# Patient Record
Sex: Male | Born: 1965
Health system: Southern US, Community
[De-identification: ages and names within clinical notes are randomized; demographics above are authoritative.]

## PROBLEM LIST (undated history)

## (undated) DIAGNOSIS — I219 Acute myocardial infarction, unspecified: Secondary | ICD-10-CM

## (undated) DIAGNOSIS — N289 Disorder of kidney and ureter, unspecified: Secondary | ICD-10-CM

## (undated) DIAGNOSIS — I10 Essential (primary) hypertension: Secondary | ICD-10-CM

## (undated) DIAGNOSIS — G4730 Sleep apnea, unspecified: Secondary | ICD-10-CM

## (undated) DIAGNOSIS — C801 Malignant (primary) neoplasm, unspecified: Secondary | ICD-10-CM

## (undated) HISTORY — PX: ESOPHAGOGASTRODUODENOSCOPY: SHX1529

## (undated) HISTORY — PX: KIDNEY SURGERY: SHX687

---

## 2006-09-03 ENCOUNTER — Inpatient Hospital Stay (HOSPITAL_COMMUNITY): Admission: EM | Admit: 2006-09-03 | Discharge: 2006-09-06 | Payer: Self-pay | Admitting: Emergency Medicine

## 2006-09-04 ENCOUNTER — Ambulatory Visit: Payer: Self-pay | Admitting: Internal Medicine

## 2007-12-12 ENCOUNTER — Ambulatory Visit (HOSPITAL_COMMUNITY): Admission: RE | Admit: 2007-12-12 | Discharge: 2007-12-12 | Payer: Self-pay | Admitting: Urology

## 2008-01-20 ENCOUNTER — Emergency Department (HOSPITAL_COMMUNITY): Admission: EM | Admit: 2008-01-20 | Discharge: 2008-01-20 | Payer: Self-pay | Admitting: Emergency Medicine

## 2010-10-16 NOTE — Consult Note (Signed)
[name], [name]               ACCOUNT NO.:  [id number]   MEDICAL RECORD NO.:  [id number]          PATIENT TYPE:  INP   LOCATION:  A313                          FACILITY:  [facility]   PHYSICIAN:  Dennie Maizes, M.D.   DATE OF BIRTH:  December 11, 1965   DATE OF CONSULTATION:  DATE OF DISCHARGE:                                 CONSULTATION   REASON FOR CONSULTATION:  Enhancing right renal mass.   CONSULTATION REPORT:  This 45 year old male had a large bowel movement  and stools that were black.  He passed out at home, and he was brought  to the emergency room by ambulance.  He was hypertensive and he was  resuscitated.  He has been evaluated by Dr. Sherene Sires.  EGD has revealed  bleeding duodenal ulcer.  There was a bleeding vessel which was treated  with injection and thermal ceiling.  The patient has done well after  that.   He has undergone evaluation with a CT scan of abdomen and pelvis with  and without contrast.  This revealed a 4 cm sized enhancing mass in the  upper pole of the right kidney.  Part of the mass is exophytic.   The patient denied having any voiding difficulty, hematuria or flank  pain in the past.  Has undergone evaluation at [facility] in [place] in  2005.  He had duodenitis and gastritis at that time.  He had also been  informed about the right renal mass at that time.  It was thought to be  a cyst and the patient was advised to have regular follow-up.  Has not  seen his urologist for several years.   PAST MEDICAL HISTORY:  1. History of duodenitis and gastritis in 2005.  2. History of renal mass noted at that time.  3. Hyperthyroidism.   MEDICATIONS:  None   ALLERGIES:  None.   PHYSICAL EXAMINATION:  ABDOMEN:  Soft.  No palpable flank mass.  No  costovertebral angle tenderness.  Bladder not palpable.  Penis and  testes are normal.   ADMISSION LABORATORIES:  BUN 13, creatinine 0.92, electrolytes within  normal range.  CBC:  WBC 7.0, hemoglobin 10.1, hematocrit  28.4.  Urinalysis was completely negative.  PT 13.9, PTT 23, INR 1.1.  CT scan  of abdomen and pelvis with and without contrast revealed a 4 cm sized  enhancing mass within the lateral aspect of upper pole of the right  kidney.  Renal veins were patent.  There was no evidence of intra-  abdominal metastatic lesions.   IMPRESSION:  A 4 cm size enhancing right renal mass, possible renal cell  carcinoma.   PLAN:  I informed the patient regarding the possible diagnosis and  management options.  He is interested in laparoscopic/robotic surgery.  Will refer him to [facility] for possible  laparoscopic/robotic/ partial or radical nephrectomy.  The patient will  return to the office on 09/09/2006.  I plan to make arrangements for the  patient's referral to [facility] at that time.      Dennie Maizes, M.D.  Electronically Signed  SK/MEDQ  D:  09/05/2006  T:  09/06/2006  Job:  [id number]

## 2010-10-16 NOTE — H&P (Signed)
[name], [name]               ACCOUNT NO.:  [id number]   MEDICAL RECORD NO.:  [id number]          PATIENT TYPE:  INP   LOCATION:  IC03                          FACILITY:  [facility]   PHYSICIAN:  Margaretmary Dys, M.D.DATE OF BIRTH:  1966-05-06   DATE OF ADMISSION:  09/03/2006  DATE OF DISCHARGE:  LH                              HISTORY & PHYSICAL   ADMISSION DIAGNOSES:  1. Gastrointestinal bleed.  2. Syncope.  3. Obesity.   HISTORY OF PRESENT ILLNESS:  [name] is a 45 year old African-  American male who was brought into the emergency room by 911.  The  patient reported that he was doing fairly well until he went into the  bathroom and had a large bowel movement consisting of mostly black-  appearing stools.  He also had some maroon-colored stools.  He lives  pretty close to the hospital and was going to walk to the emergency  room, but when he was getting ready to leave the house, he passed out.  His daughter who is 4 years old subsequently called 911 and EMS came to  his house.  When he arrived, he was hypotensive with systolic blood  pressure in the 90 range.  In the emergency room, he was found to be  hemodynamically stable.  He received some IV fluids.  He was seen by  Alvin Critchley, M.D. who obtained his vital signs.  There was no other  evidence of active bright red bleeding at the time.  Orthostatic vitals  were not checked.  The patient was subsequently transferred to the  intensive care unit for further evaluation and management.   REVIEW OF SYSTEMS:  A 10-point review of systems was otherwise negative  except as mentioned in history of present illness.   PAST MEDICAL HISTORY:  1. History of duodenitis at [facility] in [place] back in 2005.  The      patient said he had an endoscopy which confirmed some duodenitis      and he was supposed to be on Nexium, but has not been taking it.  2. History of right kidney mass.  The exact etiology remains unclear.  3.  Hyperthyroid.   MEDICATIONS:  None.   ALLERGIES:  No known drug allergies.   SOCIAL HISTORY:  The patient is divorced and lives with his 13 year old  daughter.  He just recently got laid off from work.   He denies any alcohol or illicit drugs.  He does not smoke.   FAMILY HISTORY:  No family history of ulcers, hypertension, or diabetes.   PHYSICAL EXAMINATION:  GENERAL:  Conscious, alert, comfortable, not in  acute distress.  VITAL SIGNS:  Remained stable with blood pressure 127/68, pulse ranged  between 108 to 113, respiratory rate 16.  TMAX was 98.5.  Oxygen  saturation was 98% on room air.  HEENT:  Normocephalic and atraumatic.  Oral mucosa was moist with no  exudates.  NECK:  Supple, no JVD.  No lymphadenopathy.  LUNGS:  Clear clinically with good air entry bilaterally.  HEART:  S1 and S2 regular, no S3, S4, gallops, or  rubs.  ABDOMEN:  Soft and nontender.  Bowel sounds positive.  No masses  palpable.  EXTREMITIES:  No edema.  NEUROLOGY:  Grossly intact with no focal neurological deficits.   LABORATORY DATA:  His white blood cell count 15.7, hemoglobin 11.6,  hematocrit 32.7, platelet count 202, neutrophils 85%.  PT was 13.9, INR  1.1, PTT of 23.  Sodium 136, potassium 4.3, chloride 110, CO2 22,  glucose 99, BUN 38, creatinine 0.97, calcium 8.2.  Urinalysis was  negative.   ASSESSMENT:  [name] is a 45 year old African-American male who  presented to the emergency room with syncope and symptoms consistent  with GI bleed.  Based on the appearance of his bowel movement and his  elevated BUN it is likely that this is an upper GI bleed.  Also the  patient was told in the past that he has duodenitis and duodenal ulcer,  but did not take any therapy.  We are awaiting other records from [facility] to further confirm this.   The patient was seen by R. Roetta Sessions, M.D. and an EGD is planned for  this morning.  He remains stable, has not required any blood   transfusions.  We will continue on current fluid therapy and PPI b.i.d.  I have explained the above plan to the patient and he verbalized full  understanding.  We will obtain further records from [facility]  regarding the mass in his right kidney and we will proceed with a CT  scan of the abdomen and pelvis in the morning to further evaluate it.      Margaretmary Dys, M.D.  Electronically Signed     AM/MEDQ  D:  09/04/2006  T:  09/04/2006  Job:  [id number]

## 2010-10-16 NOTE — Consult Note (Signed)
NAMEESPEN, BETHEL               ACCOUNT NO.:  [id number]   MEDICAL RECORD NO.:  [id number]          PATIENT TYPE:  INP   LOCATION:  IC03                          FAC[facility]:  [facility]   PHYSICIAN:  R. Roetta Sessions, M.D. DATE OF BIRTH:  [birth year]   DATE OF CONSULTATION:  09/03/2006  DATE OF DISCHARGE:                                 CONSULTATION   REASON FOR CONSULTATION:  GI bleed.   HISTORY OF PRESENT ILLNESS:  Mr. [name] is a pleasant 45 year old  African American male who was in his usual state of apparent good health  until this evening after his daughter made some hamburgers.  He had the  urge to have a bowel movement, went to the bathroom, and had a large  voluminous black-appearing bowel movement with some maroon-colored  stool.  He became concerned about this observation and was making plans  to walk to the emergency department (he lives about a half block from  the hospital).  When he was getting ready to go, he passed out at home.  His daughter activated EMS, called 911.  He was found to be hypotensive,  when EMS arrived with a pressure in the 90 range.  He was brought to  [facility], where he was seen and resuscitated with fluids by  Dr. Neale Burly.  He rapidly stabilized.   INITIAL LABORATORY STUDIES:  Revealed a white count of 15,700, H&H 11.6  and 32.7, MCV 81.9, platelet count 202,000.  ProTime INR 1.1. BUN 38,  creatinine 0.97.   Mr. [name] is now seen in the ICU where he is alert, and appears  comfortable.  He has a blood pressure of 132/89, a pulse of 100.  Mr.  [name] tells me that he has not had any recent associated abdominal  pain.  His bowel movements have been normal up until this evening.  He  has not lost any weight.  No upper GI tract symptoms such as  odynophagia, dysphagia, no sign of reflux symptoms, nausea, or vomiting.  He gives a vague history of having a upper and lower endoscopy for some  stomach problems at [facility] in [place]  back in 2005.  He was  prescribed Nexium.  He failed to follow up with any other doctors as  instructed and subsequently moved to [place].  Apparently, he  describes as being hemoccult positive at that time but he did not have  any significant findings and the records are not available at this time  for review.   Mr. [name] is not taking any medications.  He denies any form of  nonsteroidals, ASA, OTC on review.  He is not allergic to any  medications.  He does not use alcohol or illicit drugs.  There is no  family history of GI problems including GI neoplasia.   PAST MEDICAL HISTORY:  1. Significant for a workup for some GI problems at [facility] in      [place] in 2005, the details are sketchy as described above.  2. History of inguinal hernia repair as a child.   CURRENT MEDICATIONS:  None.  ALLERGIES:  NO KNOWN DRUG ALLERGIES.   FAMILY HISTORY:  No chronic GI or liver illness.   SOCIAL HISTORY:  The patient is single.  He is originally from  [place].  He has two grown daughters in [place].  He has one 10-year-  old daughter who lives with him here in [place].  He does not smoke,  does not use alcohol or illicit drugs.   REVIEW OF SYSTEMS:  There has been no weight loss recently, no fever,  chills, no chest pain, no dyspnea.   PHYSICAL EXAMINATION:  GENERAL:  Reveals a very pleasant 45 year old  gentleman, resting comfortably in ICU bed 3.  VITAL SIGNS:  Blood pressure 132/89, pulse 100, respiratory rate 18.  SKIN:  Warm and dry.  HEENT:  No scleral icterus.  Conjunctivae are pink.  Oral cavity:  No  lesions.  CHEST:  Lungs are clear to auscultation.  CARDIAC:  Regular rate and rhythm without murmur, gallop or rub.  ABDOMEN:  Somewhat obese, positive bowel sounds, entirely soft and  nontender without appreciable or organomegaly.  EXTREMITIES:  No edema.   LABORATORY DATA:  White count 15.7, H&H 11.6 and 32.7, MCV 81.9,  platelet count 202,000.  ProTime  13.9, INR 1.1.  Sodium 136, potassium  4.3, and chloride 110, CO2 22, glucose 99, BUN 38, creatinine 0.97.   IMPRESSION:  Mr. [name] is a very pleasant 45 year old African  American male admitted to the hospital this evening with a  hemodynamically significant gastrointestinal bleed.  He describes some  melena and given his elevated BUN, I suspect the origin of bleeding is  more upper gastrointestinal tract than the distal small bowel or colon.  At this point in time, he appears hemodynamically stable.  As he  equilibrates, his hemoglobin may likely fall significantly from the  evening's baseline.  As far as a discrete etiology is concerned, he will  need further evaluation.  He really does not have any apparent risk  factors for peptic ulcer disease.  He give a sketchy history of  endoscopic evaluation by gastroenterologist in [place] back in 2005,  would certainly like to review those records and will attempt to get  them.   RECOMMENDATIONS:  1. Lets hold off on NG tube for the time being.  2. Continue close observation in the ICU.  3. I fully agree with the b.i.d. PPI therapy.  4. I agree with following H&H, would consider transfusion if his H&H      plummets to keep his hemoglobin and hematocrit in the 10 and 30      range.  5. I have offered Mr. [name] an EGD tomorrow morning.  It would be a      bedside procedure in the ICU.  I have discussed the risks,      benefits, and alternatives with Mr. [name].  His questions were      answered.  He is agreeable.  I will try and get those records faxed      over from [facility] in the next 24 hours.   I would like to thank Dr. Alvin Critchley and Dr. Dorris Singh for  allowing me to see this very nice gentleman this evening.  I will be  following Mr. [name] along with Dr. Elige Radon while he is here.      Jonathon Bellows, M.D.  Electronically Signed    RMR/MEDQ  D:  09/03/2006  T:  09/03/2006  Job:  [id number]   cc:  Alvin Critchley, M.D.  [address]  [place], [place] [address]   Dorris Singh  Fax: [phone]

## 2010-10-16 NOTE — Op Note (Signed)
NAMENELLIE, CHEVALIER               ACCOUNT NO.:  [id number]   MEDICAL RECORD NO.:  [id number]          PATIENT TYPE:  INP   LOCATION:  IC03                          FACILITY:  [facility]   PHYSICIAN:  R. Roetta Sessions, M.D. DATE OF BIRTH:  1965-09-18   DATE OF PROCEDURE:  09/04/2006  DATE OF DISCHARGE:                                PROCEDURE NOTE   PROCEDURE:  EGD at the bedside in the ICU with therapeutic intervention.   INDICATIONS FOR PROCEDURE:  The patient is a 45 year old gentleman who  presented to the hospital last night after having an episode of  melena/maroon stools with syncope.  He was fully resuscitated in the  emergency department.  He has been stable over night.  His H&H's remain  stable at 11.5 and 32.5 this morning.   His old records from [facility] in [place] have made it to our  chart.   Review revealed that he had an EGD in 2005.  He was found to have  gastritis and duodenitis.  HP serologies were positive.  He has not been  treated.  He has also had a suspicious right renal lesion for which  followup was recommended.  This was not done either.  EGD is now being  done at the bedside in the ICU.  The potential risks, benefits, and  alternatives have been reviewed with Mr. Nordahl last night and this  morning.  He is agreeable.  His questions were answered.   PROCEDURE:  O2 saturation, blood pressure, pulse, and respirations were  monitored throughout the entire procedure.  Conscious sedation:  Versed  3 mg IV and Demerol 100 mg IV in divided doses.  Cetacaine spray for  topical oropharyngeal anesthesia.  Instrument:  Pentax video chip  system.   FINDINGS:  Examination of the tubular esophagus revealed a non-critical  Schatzki's ring; otherwise, the mucosa appeared normal.  The EG junction  was easily traversed.   Stomach:  The gastric cavity was empty and insufflated well with air.  Thorough examination of the gastric mucosa including retroflexed view  of  the proximal stomach and esophagogastric junction demonstrated a small  hiatal hernia and some small antral prepyloric erosions.  There was no  blood in the stomach.  The pylorus was patent and easily traversed.   Examination of the bulb and second portion revealed a 1-cm ulcer in the  bulb with a central visible vessel.  There were surrounding erosions of  the bulb; otherwise, D1 and D2 appeared normal.   THERAPEUTIC/DIAGNOSTIC MANEUVERS:  4 mL of 1:10,000 epinephrine was  injected on either side of the ulcer crater.  The visible vessel was  sealed with four applications of the Gold probe at 25 joules each.  This  was done without difficulty and minimal bleeding.   The patient tolerated the procedure well and was reactive in the ICU.   IMPRESSION:  1. Non-critical Schatzki's ring; otherwise, normal esophagus.  2. Small hiatal hernia.  3. Antral and bulbar erosions.  4. A 1-CM DUODENAL BULBAR ULCER WITH VISIBLE VESSEL TREATED WITH      INJECTION  AND THERMAL SEALING, AS DESCRIBED ABOVE.  Otherwise, the      first and second portions of the duodenum appeared normal.   RECOMMENDATIONS:  1. Begin Prevpac p.o. x14 days.  2. Clear liquid diet today.  3. Will defer to Dr. Elige Radon and associates as to workup of the right      renal lesion seen on prior CT.  4. Will repeat his CBC tomorrow morning.      Jonathon Bellows, M.D.  Electronically Signed     RMR/MEDQ  D:  09/04/2006  T:  09/04/2006  Job:  [id number]   cc:   Ethelene Browns., M.D.  Fax: [phone]   Alvin Critchley, M.D.  [address]  [place], [place] [address]

## 2010-10-16 NOTE — Discharge Summary (Signed)
[name], [name]               ACCOUNT NO.:  [id number]   MEDICAL RECORD NO.:  [id number]          PATIENT TYPE:  INP   LOCATION:  A313                          FACILITY:  [facility]   PHYSICIAN:  Osvaldo Shipper, MD     DATE OF BIRTH:  1966-03-05   DATE OF ADMISSION:  09/03/2006  DATE OF DISCHARGE:  04/08/2008LH                               DISCHARGE SUMMARY   Patient does not have a primary medical doctor.   DISCHARGE DIAGNOSES:  1. Acute blood loss anemia secondary to gastrointestinal bleed.  2. Duodenal ulcer is the likely source of bleeding.  3. Obesity.  4. Right renal mass likely renal cell carcinoma, requiring followup.   Please review H&P dictated by Dr. Sherle Poe.   BRIEF HOSPITAL COURSE:  Briefly, this is a 45 year old African-American  male who presented to the emergency department complaining of black  stools, lightheadedness, dizziness.  He was found to be initially  hypotensive with systolic blood pressures in the 90's.  He received IV  fluids.  His hemoglobin was found to be 11.6; hence, he was not  transfused.  He was monitored in the ICU and he underwent endoscopy  which was not a complete study but it revealed a duodenal ulcer with a  visible vessel but no active bleeding was noted.  The vessel was sealed  with the help of a Gold Probe.  Patient was monitored.  He continued to  have some black stools but his stools were more formed.  His hemoglobin  did go down to 9.1 today; however, patient is asymptomatic.  I think he  is still passing some of the old blood.  I discussed this issue with Dr.  Karilyn Cota and he felt the patient could go home and then followup with Dr.  Jena Gauss in about 8 weeks' time for H. Pylori breath study and maybe a  followup endoscopy.  He was started on Prevpac which will be continued  for 14 days and after that he needs to continue with omeprazole for at  least 4-6 weeks more.  He has been asked to avoid alcoholic drinks and  products, has  been asked to avoid smoking.  He has been asked to be  compliant with his medications and follow up with all of his  appointments.   He also gave a history on admission of a possible renal mass that was  detected about 3 years ago at [facility].  This prompted a CAT scan of his  abdomen, pelvis which indeed revealed a 4 cm right renal mass consistent  with renal cell carcinoma, no metastatic process was noted.  No other  abnormalities were noted on the CT of the abdomen or pelvis.  He was  seen by Dr. Dennie Maizes, urologist, who recommended following up with  him in a few days' time to consider various options for this patient.   Patient's obesity, he was counseled on diet and weight reduction.  He  was also told to be very compliant with his medications as discussed  above.   DISCHARGE MEDICATIONS:  1. Prevpac one dose b.i.d. for 14  days.  2. Once the above is completed he should start omeprazole 20 mg once a      day until he is seen by Dr. Jena Gauss.   He has been told that if he feels lightheaded, dizzy, if he noticed  blood in his stool he needs to seek attention immediately.   FOLLOWUP:  1. With Dr. Jena Gauss in 8 weeks.  2. With Dr. Dennie Maizes on September 09, 2006.   Patient does not have a PMD.  He is having some troubles with his  insurance and his job, so he wants to defer that issue for now.  I think  as long as patient is followed by the GI folks and the urologist he  should be okay.  Once his insurance is all set up he can proceed with  PMD at that point.   Once again, consultations obtained from GI and Dr. Dennie Maizes.   PROCEDURES UNDERGONE:  An EGD as discussed above.   IMAGING STUDIES:  1. A CT of the pelvis as discussed above.  2. He also underwent a foot x-ray which was of the right foot and was      unremarkable.   TOTAL TIME AT DISCHARGE:  Forty minutes.      Osvaldo Shipper, MD  Electronically Signed     GK/MEDQ  D:  09/06/2006  T:  09/06/2006   Job:  [id number]   cc:   R. Roetta Sessions, M.D.  [address]  [place]  [place] [address]   Dennie Maizes, M.D.  Fax: [phone]

## 2010-10-16 NOTE — Group Therapy Note (Signed)
NAMECLIFTON, SAFLEY               ACCOUNT NO.:  [id number]   MEDICAL RECORD NO.:  [id number]          PATIENT TYPE:  INP   LOCATION:  IC03                          FACILITY:  [facility]   PHYSICIAN:  Margaretmary Dys, M.D.DATE OF BIRTH:  December 05, 1965   DATE OF PROCEDURE:  09/04/2006  DATE OF DISCHARGE:                                 PROGRESS NOTE   SUBJECTIVE:  Patient is comfortable, says he is hungry.  He is currently  awaiting an EGD.  He has had no evidence of active bleeding since being  in the ICU.  There is a suspicion of an upper GI bleed.  He denies any  abdominal pain.   He has been hemodynamically stable all night.   OBJECTIVE:  Conscious, alert, comfortable, not in acute distress.  VITAL SIGNS:  Blood pressure is 129/78, pulse of 91, respiration was 16,  T-max 98.5 degrees Fahrenheit, oxygen saturation was 95% on room air.  HEENT:  Normocephalic, atraumatic.  Oral mucosa was moist with no  exudates.  NECK:  Supple, no JVD, no lymphadenopathy.  LUNGS:  Clear clinically with good air entry bilaterally.  HEART:  S1, S2 regular.  No S3, S4, gallops or rubs.  ABDOMEN:  Soft, obese, bowel sounds were positive.  No epigastric  tenderness.  No guarding, no rigidity was noted.  EXTREMITIES:  No pitting, pedal edema.   LABORATORY DIAGNOSTIC DATA:  White blood cell count 12.4, hemoglobin is  stable at 11.8, hematocrit 34.6, platelet count was 234,000.  Sodium  136, potassium 4.1, chloride of 108, CO2 22, glucose is 102, BUN of 31,  creatinine was 0.88.   ASSESSMENT AND PLAN:  Mr. [name] is a 45 year old African-American male  with no significant past medical history who presents to the Emergency  Room with syncope.  He had a history of melena stools prior to the  syncopal episode.  There is a suspicion of upper GI bleed.  He has  remained hemodynamically stable throughout his hospitalization although  orthostatics were not checked.   Patient currently awaiting an EGD this  morning by GI.  I will continue  him on proton pump inhibitor twice daily.  Subsequent recommendations  depending on the findings from GI.  Will repeat a CBC at 6 p.m. tonight.   DISPOSITION:  He remains stable in the Intensive Care Unit.      Margaretmary Dys, M.D.  Electronically Signed     AM/MEDQ  D:  09/04/2006  T:  09/04/2006  Job:  [id number]

## 2011-10-07 ENCOUNTER — Other Ambulatory Visit: Payer: Self-pay

## 2011-10-14 ENCOUNTER — Ambulatory Visit: Payer: BC Managed Care – PPO | Attending: Family Medicine | Admitting: Sleep Medicine

## 2011-10-14 DIAGNOSIS — Z6841 Body Mass Index (BMI) 40.0 and over, adult: Secondary | ICD-10-CM | POA: Insufficient documentation

## 2011-10-14 DIAGNOSIS — G4700 Insomnia, unspecified: Secondary | ICD-10-CM

## 2011-10-14 DIAGNOSIS — G4733 Obstructive sleep apnea (adult) (pediatric): Secondary | ICD-10-CM | POA: Insufficient documentation

## 2011-10-16 NOTE — Procedures (Signed)
[name], WISEHART               ACCOUNT NO.:  [id number]  MEDICAL RECORD NO.:  [id number]          PATIENT TYPE:  OUT  LOCATION:  SLEEP LAB                     FACILITY:  [facility]  PHYSICIAN:  Yaretzi Ernandez A. Gerilyn Pilgrim, M.D. DATE OF BIRTH:  1965/06/13  DATE OF STUDY:  10/14/2011                           NOCTURNAL POLYSOMNOGRAM  REFERRING PHYSICIAN:  Scott A. Luking, MD  INDICATION:  A 46 year old man who had a home sleep study showing obstructive sleep apnea syndrome.  This is a titration recording.  EPWORTH SLEEPINESS SCALE: 1. BMI 45.  MEDICATIONS:  None.  ARCHITECTURAL SUMMARY:  This is a titration study.  The total recording time is 441 minutes.  Sleep efficiency 88%.  Sleep latency is 0.5 minutes.  REM latency 60 minutes.  Stage N1 4%, N2 55%, N3 12%, and REM sleep 29%.  RESPIRATORY SUMMARY:  Baseline saturation is 95, lowest saturation 86 during non-REM sleep.  The patient was placed on positive pressure and titrated between the pressure of 6 and 14, optimal pressure is 14 with resolution of events and good tolerance.  LIMB MOVEMENT SUMMARY:  PLM index 0.  ELECTROCARDIOGRAM SUMMARY:  Average heart rate is 67 with no significant dysrhythmias observed.  IMPRESSION:  Obstructive sleep apnea syndrome, which responds well to a continuous positive airway pressure of 14.   [name] A. Gerilyn Pilgrim, M.D.    KAD/MEDQ  D:  10/16/2011 17:26:33  T:  10/16/2011 23:48:01  Job:  [id number]

## 2011-12-18 ENCOUNTER — Emergency Department (HOSPITAL_COMMUNITY)
Admission: EM | Admit: 2011-12-18 | Discharge: 2011-12-18 | Disposition: A | Discharge status: Home / Self Care | Payer: BC Managed Care – PPO | Attending: Emergency Medicine | Admitting: Emergency Medicine

## 2011-12-18 ENCOUNTER — Encounter (HOSPITAL_COMMUNITY): Payer: Self-pay | Admitting: *Deleted

## 2011-12-18 DIAGNOSIS — I10 Essential (primary) hypertension: Secondary | ICD-10-CM | POA: Insufficient documentation

## 2011-12-18 DIAGNOSIS — M545 Low back pain, unspecified: Secondary | ICD-10-CM | POA: Insufficient documentation

## 2011-12-18 DIAGNOSIS — M549 Dorsalgia, unspecified: Secondary | ICD-10-CM

## 2011-12-18 DIAGNOSIS — N289 Disorder of kidney and ureter, unspecified: Secondary | ICD-10-CM | POA: Insufficient documentation

## 2011-12-18 HISTORY — DX: Disorder of kidney and ureter, unspecified: N28.9

## 2011-12-18 HISTORY — DX: Essential (primary) hypertension: I10

## 2011-12-18 HISTORY — DX: Malignant (primary) neoplasm, unspecified: C80.1

## 2011-12-18 MED ORDER — DIAZEPAM 10 MG PO TABS: 10.0000 mg | ORAL_TABLET | Freq: Three times a day (TID) | ORAL | Status: AC | PRN

## 2011-12-18 MED ORDER — OXYCODONE-ACETAMINOPHEN 5-325 MG PO TABS: 1.0000 | ORAL_TABLET | ORAL | Status: AC | PRN

## 2011-12-18 MED ORDER — OXYCODONE-ACETAMINOPHEN 5-325 MG PO TABS
1.0000 | ORAL_TABLET | Freq: Once | ORAL | Status: AC
  Administered 2011-12-18: 1 via ORAL
  Filled 2011-12-18: qty 1

## 2011-12-18 MED ORDER — DIAZEPAM 5 MG PO TABS
10.0000 mg | ORAL_TABLET | Freq: Once | ORAL | Status: AC
Start: 2011-12-18 — End: 2011-12-18
  Administered 2011-12-18: 10 mg via ORAL
  Filled 2011-12-18: qty 2

## 2011-12-18 NOTE — ED Provider Notes (Signed)
History  This chart was scribed for Flint Melter, MD by Erskine Emery. This patient was seen in room APA01/APA01 and the patient's care was started at 20:55.   CSN: [id number]  Arrival date & time 12/18/11  2024   First MD Initiated Contact with Patient 12/18/11 2055      No chief complaint on file.   (Consider location/radiation/quality/duration/timing/severity/associated sxs/prior treatment) HPI  [name] is a 46 y.o. male who presents to the Emergency Department complaining of constant moderate left lower back pain since 2 am this morning. Pt denies any h/o back pain but reports some stiffness. Pt denies taking any medication for his pain. Pt reports a straining bowel movement last night. 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   Dr. Gerda Diss is pt's PCP.   Past Medical History  Diagnosis Date  . Hypertension   . Renal disorder   . Cancer     History reviewed. No pertinent past surgical history.  History reviewed. No pertinent family history.  History  Substance Use Topics  . Smoking status: Never Smoker   . Smokeless tobacco: Not on file  . Alcohol Use: No      Review of Systems  Constitutional: Negative for fever and chills.  Respiratory: Negative for cough and shortness of breath.   Gastrointestinal: Negative for nausea and vomiting.  Musculoskeletal: Positive for back pain.  Neurological: Negative for dizziness and weakness.  All other systems reviewed and are negative.    Allergies  Review of patient's allergies indicates no known allergies.  Home Medications   Current Outpatient Rx  Name Route Sig Dispense Refill  . DIAZEPAM 10 MG PO TABS Oral Take 1 tablet (10 mg total) by mouth every 8 (eight) hours as needed (muscle spasm). 20 tablet 0  . OXYCODONE-ACETAMINOPHEN 5-325 MG PO TABS Oral Take 1 tablet by mouth every 4 (four) hours as needed for pain. 20 tablet 0    Triage Vitals: BP 148/94  Pulse 88  Temp 100.3 F  (37.9 C) (Oral)  Resp 20  Ht 5\' 7"  (1.702 m)  Wt 290 lb (131.543 kg)  BMI 45.42 kg/m2  SpO2 99%  Physical Exam  Nursing note and vitals reviewed. Constitutional: He is oriented to person, place, and time. He appears well-developed and well-nourished.  HENT:  Head: Normocephalic and atraumatic.  Right Ear: External ear normal.  Left Ear: External ear normal.  Eyes: Conjunctivae and EOM are normal. Pupils are equal, round, and reactive to light.  Neck: Normal range of motion and phonation normal. Neck supple.  Cardiovascular: Normal rate, regular rhythm, normal heart sounds and intact distal pulses.   Pulmonary/Chest: Effort normal and breath sounds normal. He exhibits no bony tenderness.  Abdominal: Soft. Normal appearance. There is no tenderness.  Musculoskeletal: He exhibits tenderness.       Left lower back pain, positive SLR at 20 degrees on the left. No spine tenderness.   Neurological: He is alert and oriented to person, place, and time. He has normal strength. No cranial nerve deficit or sensory deficit. He exhibits normal muscle tone. Coordination normal.  Skin: Skin is warm, dry and intact.  Psychiatric: He has a normal mood and affect. His behavior is normal. Judgment and thought content normal.    ED Course  Procedures (including critical care time)  DIAGNOSTIC STUDIES: Oxygen Saturation is 99% on room air, normal by my interpretation.    COORDINATION OF CARE: 21:05--I evaluated the patient and we discussed  a treatment plan including percocet and valium to which the pt agreed. I informed the pt that this will probably get better on its own and could possibly be arthritis related.   21:15--Medication orders: Oxycodone-acetaminophen (Percocet/Roxicet) 5-325 mg per tablet, 1 tablet--once   Diazepam (Valium) tablet 10 mg--once   Labs Reviewed - No data to display No results found.   1. Back pain       MDM  Nonspecific low back pain, related to unusual work.  This most likely represents musculoskeletal pain. Doubt spinal stenosis, or myelopathy. Possible radiculopathy.      Plan: Home Medications- percocet and valium; Home Treatments- rest, general ambulation, heat and occasional ibuprofen; Recommended follow up- Dr. Gerda Diss if necessary.  I personally performed the services described in this documentation, which was scribed in my presence. The recorded information has been reviewed and considered.           Flint Melter, MD 12/19/11 [phone]

## 2011-12-18 NOTE — ED Notes (Signed)
Pt reporting pain in left hip. Reports pain started about 2 am.  Denies taking any Tylenol or Ibuprofen. Denies any injury or additional activity other than normal work.

## 2013-01-16 ENCOUNTER — Other Ambulatory Visit: Payer: Self-pay | Admitting: Family Medicine

## 2013-11-09 ENCOUNTER — Ambulatory Visit (INDEPENDENT_AMBULATORY_CARE_PROVIDER_SITE_OTHER): Payer: BC Managed Care – PPO | Admitting: Family Medicine

## 2013-11-09 ENCOUNTER — Encounter: Payer: Self-pay | Admitting: Family Medicine

## 2013-11-09 VITALS — BP 190/100 | Ht 67.0 in | Wt 298.0 lb

## 2013-11-09 DIAGNOSIS — Z0189 Encounter for other specified special examinations: Secondary | ICD-10-CM

## 2013-11-09 DIAGNOSIS — I10 Essential (primary) hypertension: Secondary | ICD-10-CM

## 2013-11-09 MED ORDER — TRIAMTERENE-HCTZ 75-50 MG PO TABS: 1.0000 | ORAL_TABLET | Freq: Every day | ORAL | Status: DC

## 2013-11-09 NOTE — Progress Notes (Signed)
   Subjective:    Patient ID: SCHON ZEIDERS, male    DOB: [birth year], 48 y.o.   MRN: [id number]  Hypertension This is a chronic problem. The current episode started more than 1 year ago. The problem is uncontrolled. There are no associated agents to hypertension. There are no known risk factors for coronary artery disease. Treatments tried: triamterene-hctz. The current treatment provides significant improvement. There are no compliance problems.    Patient states he wants to discuss weight loss options.  He relates he does not get a lot of physical activity because of his work He relates he tries the healthy but sometimes he finds himself eating fast food.  Review of Systems Currently he denies chest tightness pressure pain shortness of breath nausea vomiting diarrhea    Objective:   Physical Exam  Vitals reviewed. Constitutional: He appears well-nourished. No distress.  Cardiovascular: Normal rate, regular rhythm and normal heart sounds.   No murmur heard. Pulmonary/Chest: Effort normal and breath sounds normal. No respiratory distress.  Musculoskeletal: He exhibits no edema.  Lymphadenopathy:    He has no cervical adenopathy.  Neurological: He is alert.  Psychiatric: His behavior is normal.          Assessment & Plan:  #1 HTN-low salt diet physical activity lose weight in addition to this continue blood pressure medicine followup in 3 months for recheck he ran out of his medicine I told him to try not to do that again Morbid obesity exercise diet try to lose weight would be helpful he may need to have surgery.

## 2013-11-09 NOTE — Patient Instructions (Signed)
DASH Diet  The DASH diet stands for "Dietary Approaches to Stop Hypertension." It is a healthy eating plan that has been shown to reduce high blood pressure (hypertension) in as little as 14 days, while also possibly providing other significant health benefits. These other health benefits include reducing the risk of breast cancer after menopause and reducing the risk of type 2 diabetes, heart disease, colon cancer, and stroke. Health benefits also include weight loss and slowing kidney failure in patients with chronic kidney disease.   DIET GUIDELINES  · Limit salt (sodium). Your diet should contain less than 1500 mg of sodium daily.  · Limit refined or processed carbohydrates. Your diet should include mostly whole grains. Desserts and added sugars should be used sparingly.  · Include small amounts of heart-healthy fats. These types of fats include nuts, oils, and tub margarine. Limit saturated and trans fats. These fats have been shown to be harmful in the body.  CHOOSING FOODS   The following food groups are based on a 2000 calorie diet. See your Registered Dietitian for individual calorie needs.  Grains and Grain Products (6 to 8 servings daily)  · Eat More Often: Whole-wheat bread, brown rice, whole-grain or wheat pasta, quinoa, popcorn without added fat or salt (air popped).  · Eat Less Often: White bread, white pasta, white rice, cornbread.  Vegetables (4 to 5 servings daily)  · Eat More Often: Fresh, frozen, and canned vegetables. Vegetables may be raw, steamed, roasted, or grilled with a minimal amount of fat.  · Eat Less Often/Avoid: Creamed or fried vegetables. Vegetables in a cheese sauce.  Fruit (4 to 5 servings daily)  · Eat More Often: All fresh, canned (in natural juice), or frozen fruits. Dried fruits without added sugar. One hundred percent fruit juice (½ cup [237 mL] daily).  · Eat Less Often: Dried fruits with added sugar. Canned fruit in light or heavy syrup.  Lean Meats, Fish, and Poultry (2  servings or less daily. One serving is 3 to 4 oz [85-114 g]).  · Eat More Often: Ninety percent or leaner ground beef, tenderloin, sirloin. Round cuts of beef, chicken breast, turkey breast. All fish. Grill, bake, or broil your meat. Nothing should be fried.  · Eat Less Often/Avoid: Fatty cuts of meat, turkey, or chicken leg, thigh, or wing. Fried cuts of meat or fish.  Dairy (2 to 3 servings)  · Eat More Often: Low-fat or fat-free milk, low-fat plain or light yogurt, reduced-fat or part-skim cheese.  · Eat Less Often/Avoid: Milk (whole, 2%). Whole milk yogurt. Full-fat cheeses.  Nuts, Seeds, and Legumes (4 to 5 servings per week)  · Eat More Often: All without added salt.  · Eat Less Often/Avoid: Salted nuts and seeds, canned beans with added salt.  Fats and Sweets (limited)  · Eat More Often: Vegetable oils, tub margarines without trans fats, sugar-free gelatin. Mayonnaise and salad dressings.  · Eat Less Often/Avoid: Coconut oils, palm oils, butter, stick margarine, cream, half and half, cookies, candy, pie.  FOR MORE INFORMATION  The Dash Diet Eating Plan: www.dashdiet.org  Document Released: 05/06/2011 Document Revised: 08/09/2011 Document Reviewed: 05/06/2011  ExitCare® Patient Information ©2014 [employer].

## 2014-01-29 ENCOUNTER — Encounter: Payer: Self-pay | Admitting: Family Medicine

## 2014-01-29 LAB — HEPATIC FUNCTION PANEL
ALT: 18 U/L (ref 0–53)
AST: 22 U/L (ref 0–37)
Albumin: 4.3 g/dL (ref 3.5–5.2)
Alkaline Phosphatase: 56 U/L (ref 39–117)
Bilirubin, Direct: 0.2 mg/dL (ref 0.0–0.3)
Indirect Bilirubin: 0.5 mg/dL (ref 0.2–1.2)
Total Bilirubin: 0.7 mg/dL (ref 0.2–1.2)
Total Protein: 7.5 g/dL (ref 6.0–8.3)

## 2014-01-29 LAB — BASIC METABOLIC PANEL
BUN: 17 mg/dL (ref 6–23)
CO2: 30 mEq/L (ref 19–32)
Calcium: 9.7 mg/dL (ref 8.4–10.5)
Chloride: 101 mEq/L (ref 96–112)
Creat: 1.04 mg/dL (ref 0.50–1.35)
Glucose, Bld: 81 mg/dL (ref 70–99)
Potassium: 3.9 mEq/L (ref 3.5–5.3)
Sodium: 137 mEq/L (ref 135–145)

## 2014-01-29 LAB — LIPID PANEL
Cholesterol: 181 mg/dL (ref 0–200)
HDL: 54 mg/dL (ref >39)
LDL Cholesterol: 114 mg/dL — ABNORMAL HIGH (ref 0–99)
Total CHOL/HDL Ratio: 3.4 Ratio
Triglycerides: 67 mg/dL (ref <150)
VLDL: 13 mg/dL (ref 0–40)

## 2014-03-17 ENCOUNTER — Emergency Department (HOSPITAL_COMMUNITY)
Admission: EM | Admit: 2014-03-17 | Discharge: 2014-03-17 | Disposition: A | Discharge status: Home / Self Care | Payer: BC Managed Care – PPO | Attending: Emergency Medicine | Admitting: Emergency Medicine

## 2014-03-17 ENCOUNTER — Encounter (HOSPITAL_COMMUNITY): Payer: Self-pay | Admitting: Emergency Medicine

## 2014-03-17 DIAGNOSIS — J0100 Acute maxillary sinusitis, unspecified: Secondary | ICD-10-CM | POA: Diagnosis not present

## 2014-03-17 DIAGNOSIS — H53149 Visual discomfort, unspecified: Secondary | ICD-10-CM | POA: Insufficient documentation

## 2014-03-17 DIAGNOSIS — I10 Essential (primary) hypertension: Secondary | ICD-10-CM | POA: Insufficient documentation

## 2014-03-17 DIAGNOSIS — R51 Headache: Secondary | ICD-10-CM | POA: Diagnosis not present

## 2014-03-17 DIAGNOSIS — H9209 Otalgia, unspecified ear: Secondary | ICD-10-CM | POA: Insufficient documentation

## 2014-03-17 DIAGNOSIS — Z87448 Personal history of other diseases of urinary system: Secondary | ICD-10-CM | POA: Diagnosis not present

## 2014-03-17 DIAGNOSIS — R599 Enlarged lymph nodes, unspecified: Secondary | ICD-10-CM | POA: Insufficient documentation

## 2014-03-17 DIAGNOSIS — Z859 Personal history of malignant neoplasm, unspecified: Secondary | ICD-10-CM | POA: Insufficient documentation

## 2014-03-17 DIAGNOSIS — Z792 Long term (current) use of antibiotics: Secondary | ICD-10-CM | POA: Insufficient documentation

## 2014-03-17 DIAGNOSIS — K029 Dental caries, unspecified: Secondary | ICD-10-CM | POA: Diagnosis not present

## 2014-03-17 DIAGNOSIS — H5710 Ocular pain, unspecified eye: Secondary | ICD-10-CM | POA: Insufficient documentation

## 2014-03-17 DIAGNOSIS — R6884 Jaw pain: Secondary | ICD-10-CM | POA: Diagnosis present

## 2014-03-17 MED ORDER — AMOXICILLIN 250 MG PO CAPS
500.0000 mg | ORAL_CAPSULE | Freq: Once | ORAL | Status: AC
  Administered 2014-03-17: 500 mg via ORAL
  Filled 2014-03-17: qty 2

## 2014-03-17 MED ORDER — OXYCODONE-ACETAMINOPHEN 5-325 MG PO TABS: 1.0000 | ORAL_TABLET | ORAL | Status: DC | PRN

## 2014-03-17 MED ORDER — AMOXICILLIN-POT CLAVULANATE 500-125 MG PO TABS: 1.0000 | ORAL_TABLET | Freq: Three times a day (TID) | ORAL | Status: DC

## 2014-03-17 MED ORDER — IBUPROFEN 800 MG PO TABS
800.0000 mg | ORAL_TABLET | Freq: Once | ORAL | Status: AC
  Administered 2014-03-17: 800 mg via ORAL
  Filled 2014-03-17: qty 1

## 2014-03-17 NOTE — ED Notes (Signed)
Patient with no complaints at this time. Respirations even and unlabored. Skin warm/dry. Discharge instructions reviewed with patient at this time. Patient given opportunity to voice concerns/ask questions. Patient discharged at this time and left Emergency Department with steady gait.   

## 2014-03-17 NOTE — Discharge Instructions (Signed)
Dental Caries °Dental caries is tooth decay. This decay can cause a hole in teeth (cavity) that can get bigger and deeper over time. °HOME CARE °· Brush and floss your teeth. Do this at least two times a day. °· Use a fluoride toothpaste. °· Use a mouth rinse if told by your dentist or doctor. °· Eat less sugary and starchy foods. Drink less sugary drinks. °· Avoid snacking often on sugary and starchy foods. Avoid sipping often on sugary drinks. °· Keep regular checkups and cleanings with your dentist. °· Use fluoride supplements if told by your dentist or doctor. °· Allow fluoride to be applied to teeth if told by your dentist or doctor. °Document Released: 02/24/2008 Document Revised: 10/01/2013 Document Reviewed: 05/19/2012 °ExitCare® Patient Information ©2015 [employer]. This information is not intended to replace advice given to you by your health care provider. Make sure you discuss any questions you have with your health care provider. ° °Dental Pain °Toothache is pain in or around a tooth. It may get worse with chewing or with cold or heat.  °HOME CARE °· Your dentist may use a numbing medicine during treatment. If so, you may need to avoid eating until the medicine wears off. Ask your dentist about this. °· Only take medicine as told by your dentist or doctor. °· Avoid chewing food near the painful tooth until after all treatment is done. Ask your dentist about this. °GET HELP RIGHT AWAY IF:  °· The problem gets worse or new problems appear. °· You have a fever. °· There is redness and puffiness (swelling) of the face, jaw, or neck. °· You cannot open your mouth. °· There is pain in the jaw. °· There is very bad pain that is not helped by medicine. °MAKE SURE YOU:  °· Understand these instructions. °· Will watch your condition. °· Will get help right away if you are not doing well or get worse. °Document Released: 11/03/2007 Document Revised: 08/09/2011 Document Reviewed: 11/03/2007 °ExitCare® Patient  Information ©2015 [employer]. This information is not intended to replace advice given to you by your health care provider. Make sure you discuss any questions you have with your health care provider. ° °

## 2014-03-17 NOTE — ED Notes (Signed)
PT reports right jaw pain that radiates to his left temple x2 days.

## 2014-03-17 NOTE — ED Provider Notes (Signed)
CSN: [id number]     Arrival date & time 03/17/14  1751 History  This chart was scribed for non-physician practitioner, Debroah Baller, PA-C,working with Mariea Clonts, MD, by Marlowe Kays, ED Scribe. This patient was seen in room APFT20/APFT20 and the patient's care was started at 6:48 PM.  Chief Complaint  Patient presents with  . Jaw Pain   The history is provided by the patient. No language interpreter was used.   HPI Comments:  [name] is a 48 y.o. obese male with PMHx of HTN, cancer and renal disorder who presents to the Emergency Department complaining of moderate right jaw pain onset two days ago. Pt states it feels as if someone is pressing on his right eye and is also experiencing photophobia. He reports associated right ear pain as well. Reports pain at 10/10 stating this is the worst HA he has ever experienced. He states he has a couple teeth that may be the source of the problem. Reports using Orajel with no significant relief of the pain. Has not taken anything orally for pain. He denies fever, chills, nausea or vomiting. His PCP is Dr. Wolfgang Phoenix.   Past Medical History  Diagnosis Date  . Hypertension   . Renal disorder   . Cancer    Past Surgical History  Procedure Laterality Date  . Kidney surgery     No family history on file. History  Substance Use Topics  . Smoking status: Never Smoker   . Smokeless tobacco: Not on file  . Alcohol Use: No    Review of Systems  Constitutional: Negative for fever and chills.  HENT: Positive for ear pain.   Eyes: Positive for photophobia and pain.  Gastrointestinal: Negative for nausea and vomiting.  Neurological: Positive for headaches.  All other systems reviewed and are negative.   Allergies  Review of patient's allergies indicates no known allergies.  Home Medications   Prior to Admission medications   Medication Sig Start Date End Date Taking? Authorizing Provider  triamterene-hydrochlorothiazide (MAXZIDE)  75-50 MG per tablet Take 1 tablet by mouth daily. 11/09/13  Yes Kathyrn Drown, MD  amoxicillin-clavulanate (AUGMENTIN) 500-125 MG per tablet Take 1 tablet (500 mg total) by mouth every 8 (eight) hours. 03/17/14   Aleila Syverson Bunnie Pion, NP  oxyCODONE-acetaminophen (ROXICET) 5-325 MG per tablet Take 1 tablet by mouth every 4 (four) hours as needed for severe pain. 03/17/14   Mckinzee Spirito Bunnie Pion, NP   Triage Vitals: BP 147/81  Pulse 92  Temp(Src) 98.9 F (37.2 C) (Oral)  Resp 18  Ht 5\' 6"  (1.676 m)  Wt 299 lb (135.626 kg)  BMI 48.28 kg/m2  SpO2 96% Physical Exam  Constitutional: He is oriented to person, place, and time. He appears well-developed and well-nourished. No distress.  HENT:  Head: Normocephalic and atraumatic.  Right Ear: Tympanic membrane normal.  Left Ear: Tympanic membrane normal.  Nose: Right sinus exhibits maxillary sinus tenderness and frontal sinus tenderness.  Mouth/Throat: Uvula is midline, oropharynx is clear and moist and mucous membranes are normal.  Decay noted to second molar of upper right side and first molar of lower right side. Tenderness to second molar of upper right side. Missing first molar of upper right side. No TMJ click with opening.   Eyes: Conjunctivae and EOM are normal.  Neck: Normal range of motion. Neck supple.  Cardiovascular: Normal rate and regular rhythm.   Pulmonary/Chest: Effort normal. He has no wheezes. He has no rales.  Abdominal: Soft. Bowel sounds  are normal. He exhibits no mass. There is no tenderness.  Musculoskeletal: Normal range of motion. He exhibits no edema.  Lymphadenopathy:    He has cervical adenopathy (anterior).  Neurological: He is alert and oriented to person, place, and time. He has normal strength. No cranial nerve deficit or sensory deficit. He displays a negative Romberg sign. Gait normal.  Rapid alternating movement without difficulty.   Skin: Skin is warm and dry.  Psychiatric: He has a normal mood and affect. His behavior is  normal.    ED Course  Procedures (including critical care time)  DIAGNOSTIC STUDIES: Oxygen Saturation is 96% on RA, adequate by my interpretation.   COORDINATION OF CARE: 6:54 PM- Will prescribe pain medication and antibiotics. Advised pt to follow up with Dr. Wolfgang Phoenix. Will speak with Dr. Reather Converse about treatment. Pt verbalizes understanding and agrees to plan. Dr. Reather Converse in to examine the patient. Will treat for dental pain and sinusitis. Patient to follow up with Dr. Wolfgang Phoenix and with his dentist.   Medications  ibuprofen (ADVIL,MOTRIN) tablet 800 mg (800 mg Oral Given 03/17/14 1904)  amoxicillin (AMOXIL) capsule 500 mg (500 mg Oral Given 03/17/14 1904)     MDM  48 y.o. male with dental pain and right maxillary and frontal sinus tenderness.  Will start antibiotics and he will follow up with his PCP and dentist. Discussed with the patient and all questioned fully answered. He will return here if any problems arise.    Medication List    TAKE these medications       amoxicillin-clavulanate 500-125 MG per tablet  Commonly known as:  AUGMENTIN  Take 1 tablet (500 mg total) by mouth every 8 (eight) hours.     oxyCODONE-acetaminophen 5-325 MG per tablet  Commonly known as:  ROXICET  Take 1 tablet by mouth every 4 (four) hours as needed for severe pain.      ASK your doctor about these medications       triamterene-hydrochlorothiazide 75-50 MG per tablet  Commonly known as:  MAXZIDE  Take 1 tablet by mouth daily.        I personally performed the services described in this documentation, which was scribed in my presence. The recorded information has been reviewed and is accurate.    [place], [place] 03/18/14 [phone]

## 2014-03-20 NOTE — ED Provider Notes (Signed)
Headache to the right side, describes it as throbbing pain that he has for a gradually worsened couple days. He reports a history of dental problems. Denies any difficulty swallowing.  No fevers or chills.   Missing molars on the right bottom and right upper. Mild tenderness to right upper posterior gingiva. Mild tenderness to maxillae sinus.Extraocular muscle function intact, PEARRL. Visual fields intact. Gross strength and sensation intact. Upper extremities sensation intact.  Clinically sinusitis versus dental apical abscess   Mariea Clonts, MD 03/20/14 2342

## 2014-05-01 ENCOUNTER — Other Ambulatory Visit: Payer: Self-pay | Admitting: Family Medicine

## 2014-06-17 ENCOUNTER — Other Ambulatory Visit: Payer: Self-pay | Admitting: Family Medicine

## 2014-09-09 ENCOUNTER — Other Ambulatory Visit: Payer: Self-pay | Admitting: Family Medicine

## 2014-09-29 ENCOUNTER — Other Ambulatory Visit: Payer: Self-pay | Admitting: Family Medicine

## 2014-12-09 ENCOUNTER — Encounter: Payer: Self-pay | Admitting: Family Medicine

## 2014-12-09 ENCOUNTER — Ambulatory Visit (INDEPENDENT_AMBULATORY_CARE_PROVIDER_SITE_OTHER): Payer: Self-pay | Admitting: Family Medicine

## 2014-12-09 VITALS — BP 146/98 | Ht 67.0 in | Wt 306.8 lb

## 2014-12-09 DIAGNOSIS — I10 Essential (primary) hypertension: Secondary | ICD-10-CM

## 2014-12-09 MED ORDER — TRIAMTERENE-HCTZ 75-50 MG PO TABS: 1.0000 | ORAL_TABLET | Freq: Every day | ORAL | Status: DC

## 2014-12-09 MED ORDER — AMLODIPINE BESYLATE 5 MG PO TABS: 5.0000 mg | ORAL_TABLET | Freq: Every day | ORAL | Status: DC

## 2014-12-09 NOTE — Progress Notes (Signed)
   Subjective:    Patient ID: [name], male    DOB: Jul 05, 1965, 49 y.o.   MRN: [id number]  HPI  Patient arrives for blood pressure check up. Last BP check 10/2013- BP been running high and patient with c/o headaches. Patient has not been taking BP pills but found one and took one this am. Apparently the patient ran out a medication and is been out for at least 2 weeks he has had a difficult time with being able to afford insurance recently  He states he's had moderate headaches over the past couple weeks describes it more as a throbbing sensation in the back of his head no unilateral symptoms no numbness or weakness no difficulty speaking or swallowing patient denies chest pressure but has had some cough and at times feels slightly short of breath denies high fevers or chills Review of Systems  Constitutional: Positive for fever (pt feels hot at times).  HENT: Positive for sore throat.   Respiratory: Positive for cough and shortness of breath.   Cardiovascular: Negative for chest pain and leg swelling.  Gastrointestinal: Negative for abdominal pain.  Neurological: Positive for headaches.       Objective:   Physical Exam  Constitutional: He appears well-nourished. No distress.  Cardiovascular: Normal rate, regular rhythm and normal heart sounds.   No murmur heard. Pulmonary/Chest: Effort normal and breath sounds normal. No respiratory distress.  Musculoskeletal: He exhibits no edema.  Lymphadenopathy:    He has no cervical adenopathy.  Neurological: He is alert.  Psychiatric: His behavior is normal.  Vitals reviewed.  Patient had blood pressure checked by myself large cuff left side 146/98 Finger to nose is normal Romberg negative walks without difficulty no unilateral weakness       Assessment & Plan:  Hypertension with moderate headache I doubt the stroke. If he starts having vomiting with a headache or unilateral symptoms he needs to call 911 or go to ER  immediately  Very important for the patient get blood pressure under control quickly restart diarrhetic take 1 daily. Add amlodipine 5 mg daily Recheck patient in 48 hours he is to bring his blood pressure cuff with him.

## 2014-12-11 ENCOUNTER — Ambulatory Visit (INDEPENDENT_AMBULATORY_CARE_PROVIDER_SITE_OTHER): Payer: Self-pay | Admitting: Family Medicine

## 2014-12-11 ENCOUNTER — Encounter: Payer: Self-pay | Admitting: Family Medicine

## 2014-12-11 VITALS — BP 134/88 | Wt 303.2 lb

## 2014-12-11 DIAGNOSIS — I10 Essential (primary) hypertension: Secondary | ICD-10-CM

## 2014-12-11 NOTE — Progress Notes (Signed)
   Subjective:    Patient ID: CARLETON VANVALKENBURGH, male    DOB: 1965-11-24, 49 y.o.   MRN: [id number]  HPI The patient yesterday came in with elevated blood pressure was having headaches and the feeling he states he feels better now although is not 100% there is no unilateral numbness or weakness. We got him started on his regular medication and we also added amlodipine. PMH HTN obesity   Review of Systems  Constitutional: Negative for activity change, appetite change and fatigue.  HENT: Negative for congestion.   Respiratory: Negative for cough.   Cardiovascular: Negative for chest pain.  Gastrointestinal: Negative for abdominal pain.  Endocrine: Negative for polydipsia and polyphagia.  Neurological: Negative for weakness.  Psychiatric/Behavioral: Negative for confusion.       Objective:   Physical Exam  Constitutional: He appears well-nourished. No distress.  Cardiovascular: Normal rate, regular rhythm and normal heart sounds.   No murmur heard. Pulmonary/Chest: Effort normal and breath sounds normal. No respiratory distress.  Musculoskeletal: He exhibits no edema.  Lymphadenopathy:    He has no cervical adenopathy.  Neurological: He is alert.  Psychiatric: His behavior is normal.  Vitals reviewed.         Assessment & Plan:  HTN-blood pressure readings are actually better than what was present the other day. The patient's blood pressure cuff is inadequate to measure his blood pressure the patient has a large arm.  I am pleased with his progress I encouraged this patient to work very hard at losing weight and eating healthy and exercising and taking his medicine he is to notify us if any problems, recheck patient in one month

## 2014-12-22 ENCOUNTER — Emergency Department (HOSPITAL_COMMUNITY): Payer: Self-pay

## 2014-12-22 ENCOUNTER — Emergency Department (HOSPITAL_COMMUNITY)
Admission: EM | Admit: 2014-12-22 | Discharge: 2014-12-22 | Disposition: A | Discharge status: Home / Self Care | Payer: Self-pay | Attending: Emergency Medicine | Admitting: Emergency Medicine

## 2014-12-22 ENCOUNTER — Encounter (HOSPITAL_COMMUNITY): Payer: Self-pay | Admitting: Emergency Medicine

## 2014-12-22 DIAGNOSIS — Z87448 Personal history of other diseases of urinary system: Secondary | ICD-10-CM | POA: Insufficient documentation

## 2014-12-22 DIAGNOSIS — J40 Bronchitis, not specified as acute or chronic: Secondary | ICD-10-CM

## 2014-12-22 DIAGNOSIS — J209 Acute bronchitis, unspecified: Secondary | ICD-10-CM | POA: Insufficient documentation

## 2014-12-22 DIAGNOSIS — Z79899 Other long term (current) drug therapy: Secondary | ICD-10-CM | POA: Insufficient documentation

## 2014-12-22 DIAGNOSIS — Z85528 Personal history of other malignant neoplasm of kidney: Secondary | ICD-10-CM | POA: Insufficient documentation

## 2014-12-22 DIAGNOSIS — I10 Essential (primary) hypertension: Secondary | ICD-10-CM | POA: Insufficient documentation

## 2014-12-22 LAB — COMPREHENSIVE METABOLIC PANEL
ALT: 28 U/L (ref 17–63)
AST: 31 U/L (ref 15–41)
Albumin: 4.5 g/dL (ref 3.5–5.0)
Alkaline Phosphatase: 56 U/L (ref 38–126)
Anion gap: 10 (ref 5–15)
BUN: 22 mg/dL — ABNORMAL HIGH (ref 6–20)
CO2: 28 mmol/L (ref 22–32)
Calcium: 9.4 mg/dL (ref 8.9–10.3)
Chloride: 99 mmol/L — ABNORMAL LOW (ref 101–111)
Creatinine, Ser: 1.48 mg/dL — ABNORMAL HIGH (ref 0.61–1.24)
GFR calc Af Amer: >60 mL/min (ref >60)
GFR calc non Af Amer: 54 mL/min — ABNORMAL LOW (ref >60)
Glucose, Bld: 109 mg/dL — ABNORMAL HIGH (ref 65–99)
Potassium: 3.3 mmol/L — ABNORMAL LOW (ref 3.5–5.1)
Sodium: 137 mmol/L (ref 135–145)
Total Bilirubin: 0.9 mg/dL (ref 0.3–1.2)
Total Protein: 8.9 g/dL — ABNORMAL HIGH (ref 6.5–8.1)

## 2014-12-22 LAB — CBC WITH DIFFERENTIAL/PLATELET
Basophils Absolute: 0 10*3/uL (ref 0.0–0.1)
Basophils Relative: 0 % (ref 0–1)
Eosinophils Absolute: 0.1 10*3/uL (ref 0.0–0.7)
Eosinophils Relative: 1 % (ref 0–5)
HCT: 43.4 % (ref 39.0–52.0)
Hemoglobin: 14.9 g/dL (ref 13.0–17.0)
Lymphocytes Relative: 30 % (ref 12–46)
Lymphs Abs: 3.4 10*3/uL (ref 0.7–4.0)
MCH: 28.2 pg (ref 26.0–34.0)
MCHC: 34.3 g/dL (ref 30.0–36.0)
MCV: 82 fL (ref 78.0–100.0)
Monocytes Absolute: 0.7 10*3/uL (ref 0.1–1.0)
Monocytes Relative: 7 % (ref 3–12)
Neutro Abs: 7.1 10*3/uL (ref 1.7–7.7)
Neutrophils Relative %: 62 % (ref 43–77)
Platelets: 219 10*3/uL (ref 150–400)
RBC: 5.29 MIL/uL (ref 4.22–5.81)
RDW: 13.6 % (ref 11.5–15.5)
WBC: 11.3 10*3/uL — ABNORMAL HIGH (ref 4.0–10.5)

## 2014-12-22 MED ORDER — ALBUTEROL SULFATE HFA 108 (90 BASE) MCG/ACT IN AERS
2.0000 | INHALATION_SPRAY | RESPIRATORY_TRACT | Status: AC
  Administered 2014-12-22: 2 via RESPIRATORY_TRACT
  Filled 2014-12-22: qty 6.7

## 2014-12-22 MED ORDER — ALBUTEROL SULFATE (2.5 MG/3ML) 0.083% IN NEBU
2.5000 mg | INHALATION_SOLUTION | Freq: Once | RESPIRATORY_TRACT | Status: AC
  Administered 2014-12-22: 2.5 mg via RESPIRATORY_TRACT
  Filled 2014-12-22: qty 3

## 2014-12-22 MED ORDER — ALBUTEROL SULFATE (2.5 MG/3ML) 0.083% IN NEBU: 5.0000 mg | INHALATION_SOLUTION | Freq: Once | RESPIRATORY_TRACT | Status: DC

## 2014-12-22 MED ORDER — AZITHROMYCIN 250 MG PO TABS: 250.0000 mg | ORAL_TABLET | Freq: Every day | ORAL | Status: DC

## 2014-12-22 MED ORDER — IPRATROPIUM-ALBUTEROL 0.5-2.5 (3) MG/3ML IN SOLN
3.0000 mL | Freq: Once | RESPIRATORY_TRACT | Status: AC
  Administered 2014-12-22: 3 mL via RESPIRATORY_TRACT
  Filled 2014-12-22: qty 3

## 2014-12-22 MED ORDER — IPRATROPIUM BROMIDE 0.02 % IN SOLN: 0.5000 mg | RESPIRATORY_TRACT | Status: DC

## 2014-12-22 MED ORDER — BENZONATATE 100 MG PO CAPS: 100.0000 mg | ORAL_CAPSULE | Freq: Three times a day (TID) | ORAL | Status: DC

## 2014-12-22 NOTE — Discharge Instructions (Signed)

## 2014-12-22 NOTE — ED Provider Notes (Addendum)
CSN: [id number]     Arrival date & time 12/22/14  1750 History   First MD Initiated Contact with Patient 12/22/14 1916     Chief Complaint  Patient presents with  . Cough     (Consider location/radiation/quality/duration/timing/severity/associated sxs/prior Treatment) HPI  49 year old obese male with a history of a cough which has been ongoing for the last couple of weeks. He states that he was seen at his doctor's office, he has not been taking any cough medications, he has been starting new medications because of hypertension and that has been going well. The cough is occasionally productive, it is associated with a headache, it is associated with runny nose and sore throat intermittently as well. He denies any sick contacts, denies travel, denies change in speech. He does not have chest pain or back pain.  Past Medical History  Diagnosis Date  . Hypertension   . Renal disorder   . Cancer     kidney   Past Surgical History  Procedure Laterality Date  . Kidney surgery     History reviewed. No pertinent family history. History  Substance Use Topics  . Smoking status: Never Smoker   . Smokeless tobacco: Not on file  . Alcohol Use: No    Review of Systems  All other systems reviewed and are negative.     Allergies  Review of patient's allergies indicates no known allergies.  Home Medications   Prior to Admission medications   Medication Sig Start Date End Date Taking? Authorizing Provider  amLODipine (NORVASC) 5 MG tablet Take 1 tablet (5 mg total) by mouth daily. 12/09/14  Yes Kathyrn Drown, MD  triamterene-hydrochlorothiazide (MAXZIDE) 75-50 MG per tablet Take 1 tablet by mouth daily. 12/09/14  Yes Kathyrn Drown, MD  azithromycin (ZITHROMAX Z-PAK) 250 MG tablet Take 1 tablet (250 mg total) by mouth daily. 500mg  PO day 1, then 250mg  PO days 205 12/22/14   Noemi Chapel, MD  benzonatate (TESSALON) 100 MG capsule Take 1 capsule (100 mg total) by mouth every 8 (eight)  hours. 12/22/14   Noemi Chapel, MD   BP 124/67 mmHg  Pulse 92  Temp(Src) 99.1 F (37.3 C) (Oral)  Resp 24  Ht 5\' 7"  (1.702 m)  Wt 299 lb (135.626 kg)  BMI 46.82 kg/m2  SpO2 97% Physical Exam  Constitutional: He appears well-developed and well-nourished. No distress.  HENT:  Head: Normocephalic and atraumatic.  Mouth/Throat: Oropharynx is clear and moist. No oropharyngeal exudate.  Eyes: Conjunctivae and EOM are normal. Pupils are equal, round, and reactive to light. Right eye exhibits no discharge. Left eye exhibits no discharge. No scleral icterus.  Neck: Normal range of motion. Neck supple. No JVD present. No thyromegaly present.  Cardiovascular: Normal rate, regular rhythm, normal heart sounds and intact distal pulses.  Exam reveals no gallop and no friction rub.   No murmur heard. Pulmonary/Chest: Effort normal and breath sounds normal. No respiratory distress. He has no wheezes. He has no rales.  Abdominal: Soft. Bowel sounds are normal. He exhibits no distension and no mass. There is no tenderness.  Musculoskeletal: Normal range of motion. He exhibits no edema or tenderness.  Lymphadenopathy:    He has no cervical adenopathy.  Neurological: He is alert. Coordination normal.  Skin: Skin is warm and dry. No rash noted. No erythema.  Psychiatric: He has a normal mood and affect. His behavior is normal.  Nursing note and vitals reviewed.   ED Course  Procedures (including critical care time) Labs  Review Labs Reviewed  CBC WITH DIFFERENTIAL/PLATELET - Abnormal; Notable for the following:    WBC 11.3 (*)    All other components within normal limits  COMPREHENSIVE METABOLIC PANEL - Abnormal; Notable for the following:    Potassium 3.3 (*)    Chloride 99 (*)    Glucose, Bld 109 (*)    BUN 22 (*)    Creatinine, Ser 1.48 (*)    Total Protein 8.9 (*)    GFR calc non Af Amer 54 (*)    All other components within normal limits    Imaging Review Dg Chest 2 View  12/22/2014    CLINICAL DATA:  Productive cough for 2.5 weeks with chest tightness/ pain. Shortness of breath with exertion.  EXAM: CHEST  2 VIEW  COMPARISON:  CT abdomen and pelvis 12/12/2007  FINDINGS: Cardiac silhouette is within normal limits. Calcified lymph nodes are noted at the inferior aspect of the right hilum as seen on prior CT. There is minimal atelectasis in the right lung base. No segmental airspace consolidation, edema, pleural effusion, or pneumothorax is identified. No acute osseous abnormality is seen.  IMPRESSION: No active cardiopulmonary disease.   Electronically Signed   By: Logan Bores   On: 12/22/2014 18:20     EKG Interpretation   Date/Time:  Sunday December 22 2014 17:57:08 EDT Ventricular Rate:  105 PR Interval:  158 QRS Duration: 88 QT Interval:  330 QTC Calculation: 436 R Axis:   -29 Text Interpretation:  Sinus tachycardia Minimal voltage criteria for LVH,  may be normal variant Borderline ECG No old tracing to compare Confirmed  by [facility]  MD, ELLIOTT [phone]) on 12/22/2014 6:03:44 PM      MDM   Final diagnoses:  Bronchitis    The patient has normal respiratory effort, he is unable to speak more than 2 or 3 sentences without having a severe coughing attack. There is no rales or wheezing, his x-ray is negative, we'll treat for a bronchitis with medications including albuterol and Zithromax, the patient is in agreement with the plan.  Prior to d/c, the pt was informed of his results including Cr which is up slightly from prior levels -s tates he has had a partial nephrectomy 2/2 CA, will f/u for repeat Cr in 2 weeks.  Meds given in ED:  Medications  albuterol (PROVENTIL HFA;VENTOLIN HFA) 108 (90 BASE) MCG/ACT inhaler 2 puff (2 puffs Inhalation Given 12/22/14 1954)  ipratropium-albuterol (DUONEB) 0.5-2.5 (3) MG/3ML nebulizer solution 3 mL (3 mLs Nebulization Given 12/22/14 1954)  albuterol (PROVENTIL) (2.5 MG/3ML) 0.083% nebulizer solution 2.5 mg (2.5 mg Nebulization Given  12/22/14 1954)    New Prescriptions   AZITHROMYCIN (ZITHROMAX Z-PAK) 250 MG TABLET    Take 1 tablet (250 mg total) by mouth daily. 500mg  PO day 1, then 250mg  PO days 205   BENZONATATE (TESSALON) 100 MG CAPSULE    Take 1 capsule (100 mg total) by mouth every 8 (eight) hours.        Noemi Chapel, MD 12/22/14 1936  Noemi Chapel, MD 12/22/14 [phone]

## 2014-12-22 NOTE — ED Notes (Signed)
PT c/o productive cough with brown/yellow sputum x2 1/2 weeks with tightness in chest at times. PT c/o SOB upon exertion.

## 2015-01-13 ENCOUNTER — Encounter: Payer: Self-pay | Admitting: Family Medicine

## 2015-01-13 ENCOUNTER — Ambulatory Visit (INDEPENDENT_AMBULATORY_CARE_PROVIDER_SITE_OTHER): Payer: Self-pay | Admitting: Family Medicine

## 2015-01-13 VITALS — BP 130/96 | Ht 66.5 in | Wt 312.0 lb

## 2015-01-13 DIAGNOSIS — E785 Hyperlipidemia, unspecified: Secondary | ICD-10-CM

## 2015-01-13 DIAGNOSIS — E876 Hypokalemia: Secondary | ICD-10-CM

## 2015-01-13 DIAGNOSIS — I10 Essential (primary) hypertension: Secondary | ICD-10-CM

## 2015-01-13 NOTE — Progress Notes (Signed)
   Subjective:    Patient ID: [name], male    DOB: 27-Jun-1965, 49 y.o.   MRN: [id number]  HPIFollow up on HTN. Ran out of meds yesterday. Has not taken today. Not exercising yet. Walks a lot at work. Eats fruit for snacks.    Follow up ER visit on 7/24. Went because he was having a cough. Finished zpack, benzonatate, and using inhaler. Still having a cough. The cough is much better than what was he did have mold exposure in his house he is trying to take care this  Patient denies any current chest tightness pressure pain does state he ran of his pills a couple days ago. He is trying to lose some weight.    Review of Systems  Constitutional: Negative for activity change, appetite change and fatigue.  HENT: Negative for congestion.   Respiratory: Negative for cough.   Cardiovascular: Negative for chest pain.  Gastrointestinal: Negative for abdominal pain.  Endocrine: Negative for polydipsia and polyphagia.  Neurological: Negative for weakness.  Psychiatric/Behavioral: Negative for confusion.       Objective:   Physical Exam  Constitutional: He appears well-nourished. No distress.  Cardiovascular: Normal rate, regular rhythm and normal heart sounds.   No murmur heard. Pulmonary/Chest: Effort normal and breath sounds normal. No respiratory distress.  Musculoskeletal: He exhibits no edema.  Lymphadenopathy:    He has no cervical adenopathy.  Neurological: He is alert.  Psychiatric: His behavior is normal.  Vitals reviewed.         Assessment & Plan:  1. HTN (hypertension), benign Blood pressure overall decent 138/88 he will get his medications he'll he will work on diet try to lose weight patient - Basic metabolic panel  2. Hyperlipidemia Patient due for cholesterol profile - Lipid panel  3. Hypokalemia Recently in ER potassium slightly low repeat metabolic 7 - Basic metabolic panel  4. Morbid obesity Severe obesity he was counseled on exercise watching  diet try to bring weight down, discussed the possibility of gastric bypass surgery but he doubts his insurance will cover this  Follow-up in approximately 6 months sooner if issues

## 2015-01-23 ENCOUNTER — Encounter: Payer: Self-pay | Admitting: Family Medicine

## 2015-01-23 LAB — BASIC METABOLIC PANEL
BUN/Creatinine Ratio: 12 (ref 9–20)
BUN: 14 mg/dL (ref 6–24)
CO2: 26 mmol/L (ref 18–29)
Calcium: 9.8 mg/dL (ref 8.7–10.2)
Chloride: 97 mmol/L (ref 97–108)
Creatinine, Ser: 1.16 mg/dL (ref 0.76–1.27)
GFR calc Af Amer: 86 mL/min/{1.73_m2} (ref >59)
GFR calc non Af Amer: 74 mL/min/{1.73_m2} (ref >59)
Glucose: 100 mg/dL — ABNORMAL HIGH (ref 65–99)
Potassium: 3.9 mmol/L (ref 3.5–5.2)
Sodium: 140 mmol/L (ref 134–144)

## 2015-01-23 LAB — LIPID PANEL
Chol/HDL Ratio: 3 ratio units (ref 0.0–5.0)
Cholesterol, Total: 185 mg/dL (ref 100–199)
HDL: 62 mg/dL (ref >39)
LDL Calculated: 105 mg/dL — ABNORMAL HIGH (ref 0–99)
Triglycerides: 89 mg/dL (ref 0–149)
VLDL Cholesterol Cal: 18 mg/dL (ref 5–40)

## 2015-02-13 ENCOUNTER — Other Ambulatory Visit: Payer: Self-pay | Admitting: *Deleted

## 2015-02-13 MED ORDER — TRIAMTERENE-HCTZ 75-50 MG PO TABS: 1.0000 | ORAL_TABLET | Freq: Every day | ORAL | Status: DC

## 2015-02-13 MED ORDER — AMLODIPINE BESYLATE 5 MG PO TABS: 5.0000 mg | ORAL_TABLET | Freq: Every day | ORAL | Status: DC

## 2015-07-14 ENCOUNTER — Ambulatory Visit: Payer: Self-pay | Admitting: Family Medicine

## 2015-07-15 ENCOUNTER — Encounter: Payer: Self-pay | Admitting: Family Medicine

## 2015-07-21 ENCOUNTER — Ambulatory Visit: Payer: Self-pay | Admitting: Family Medicine

## 2015-07-28 ENCOUNTER — Encounter: Payer: Self-pay | Admitting: Nurse Practitioner

## 2015-07-28 ENCOUNTER — Ambulatory Visit (INDEPENDENT_AMBULATORY_CARE_PROVIDER_SITE_OTHER): Payer: Self-pay | Admitting: Nurse Practitioner

## 2015-07-28 VITALS — BP 138/78 | Temp 98.2°F | Ht 65.6 in | Wt 328.8 lb

## 2015-07-28 DIAGNOSIS — J069 Acute upper respiratory infection, unspecified: Secondary | ICD-10-CM

## 2015-07-28 DIAGNOSIS — J209 Acute bronchitis, unspecified: Secondary | ICD-10-CM

## 2015-07-28 DIAGNOSIS — B9689 Other specified bacterial agents as the cause of diseases classified elsewhere: Secondary | ICD-10-CM

## 2015-07-28 DIAGNOSIS — I10 Essential (primary) hypertension: Secondary | ICD-10-CM

## 2015-07-28 DIAGNOSIS — J218 Acute bronchiolitis due to other specified organisms: Secondary | ICD-10-CM | POA: Insufficient documentation

## 2015-07-28 HISTORY — DX: Acute bronchiolitis due to other specified organisms: J21.8

## 2015-07-28 MED ORDER — AMLODIPINE BESYLATE 5 MG PO TABS: 5.0000 mg | ORAL_TABLET | Freq: Every day | ORAL | Status: DC

## 2015-07-28 MED ORDER — HYDROCODONE-HOMATROPINE 5-1.5 MG/5ML PO SYRP: 5.0000 mL | ORAL_SOLUTION | ORAL | Status: DC | PRN

## 2015-07-28 MED ORDER — AZITHROMYCIN 250 MG PO TABS: ORAL_TABLET | ORAL | Status: DC

## 2015-07-28 MED ORDER — TRIAMTERENE-HCTZ 75-50 MG PO TABS: 1.0000 | ORAL_TABLET | Freq: Every day | ORAL | Status: DC

## 2015-07-28 MED ORDER — PHENTERMINE HCL 37.5 MG PO TABS: 37.5000 mg | ORAL_TABLET | Freq: Every day | ORAL | Status: DC

## 2015-07-30 ENCOUNTER — Encounter: Payer: Self-pay | Admitting: Nurse Practitioner

## 2015-07-30 NOTE — Progress Notes (Signed)
Subjective: presents complaints of cough and congestion over the past 2 weeks. Now having frequent cough producing yellow to clear mucus. Worsening cough last night, nonstop. Wheezing has improved. No fever sore throat headache or ear pain. Neck and ribs are sore from coughing. No acid reflux or heartburn. Nonsmoker, stopped smokig over 10 years ago. Also needs refills on his blood pressure medication. No chest pain or ischemic type pain. No shortness of breath. Minimal edema if he takes his fluid pill. Also would like to try medication to help with weight loss. Has taken phentermine without difficulty in the past.   Objective:   BP 138/78 mmHg  Temp(Src) 98.2 F (36.8 C) (Oral)  Ht 5' 5.6" (1.666 m)  Wt 328 lb 12.8 oz (149.143 kg)  BMI 53.73 kg/m2  NAD. Alert, oriented. TMs clear effusion, no erythema. Pharynx injected with PND noted. Neck supple with mild soft anterior adenopathy. Lungs scattered expiratory crackles, no wheezing or tachypnea. Heart regular rate rhythm. Lower extremities no edema. Significant central obesity noted.  Assessment:  Problem List Items Addressed This Visit      Cardiovascular and Mediastinum   HTN (hypertension), benign   Relevant Medications   amLODipine (NORVASC) 5 MG tablet   triamterene-hydrochlorothiazide (MAXZIDE) 75-50 MG tablet     Other   Morbid obesity (HCC)   Relevant Medications   phentermine (ADIPEX-P) 37.5 MG tablet    Other Visit Diagnoses    Bacterial upper respiratory infection    -  Primary    Relevant Medications    azithromycin (ZITHROMAX Z-PAK) 250 MG tablet    Acute bronchitis, unspecified organism           Plan:  Meds ordered this encounter  Medications  . amLODipine (NORVASC) 5 MG tablet    Sig: Take 1 tablet (5 mg total) by mouth daily.    Dispense:  90 tablet    Refill:  0    Order Specific Question:  Supervising Provider    Answer:  Mikey Kirschner [[id number]]  . triamterene-hydrochlorothiazide (MAXZIDE) 75-50 MG tablet     Sig: Take 1 tablet by mouth daily.    Dispense:  90 tablet    Refill:  0    Order Specific Question:  Supervising Provider    Answer:  Mikey Kirschner [[id number]]  . azithromycin (ZITHROMAX Z-PAK) 250 MG tablet    Sig: Take 2 tablets (500 mg) on  Day 1,  followed by 1 tablet (250 mg) once daily on Days 2 through 5.    Dispense:  6 each    Refill:  0    Order Specific Question:  Supervising Provider    Answer:  Mikey Kirschner [[id number]]  . HYDROcodone-homatropine (HYCODAN) 5-1.5 MG/5ML syrup    Sig: Take 5 mLs by mouth every 4 (four) hours as needed.    Dispense:  120 mL    Refill:  0    Order Specific Question:  Supervising Provider    Answer:  Mikey Kirschner [[id number]]  . phentermine (ADIPEX-P) 37.5 MG tablet    Sig: Take 1 tablet (37.5 mg total) by mouth daily before breakfast.    Dispense:  30 tablet    Refill:  2    Order Specific Question:  Supervising Provider    Answer:  Mikey Kirschner [[id number]]    start phentermine after illness has resolved. Reviewed potential adverse effects. DC med and call if any problems. Drowsiness precautions for Hycodan syrup. OTC meds as directed during the day such  as Mucinex DM. Call back if worsens or persists. Otherwise follow-up in 6 months. Routine labs at next visit.

## 2015-10-19 ENCOUNTER — Other Ambulatory Visit: Payer: Self-pay | Admitting: Nurse Practitioner

## 2016-03-11 ENCOUNTER — Other Ambulatory Visit: Payer: Self-pay | Admitting: Family Medicine

## 2016-03-11 NOTE — Telephone Encounter (Signed)
You're listed as patient's  PCP

## 2016-03-11 NOTE — Telephone Encounter (Signed)
Dr. Richardson Landry to see this

## 2016-03-11 NOTE — Telephone Encounter (Signed)
Refused-patient needs office visit

## 2016-10-04 ENCOUNTER — Other Ambulatory Visit: Payer: Self-pay | Admitting: Family Medicine

## 2016-10-05 MED ORDER — TRIAMTERENE-HCTZ 75-50 MG PO TABS: 1.0000 | ORAL_TABLET | Freq: Every day | ORAL | 0 refills | Status: DC

## 2016-11-18 ENCOUNTER — Encounter: Payer: Self-pay | Admitting: Family Medicine

## 2016-11-18 ENCOUNTER — Ambulatory Visit (INDEPENDENT_AMBULATORY_CARE_PROVIDER_SITE_OTHER): Payer: Self-pay | Admitting: Family Medicine

## 2016-11-18 ENCOUNTER — Other Ambulatory Visit: Payer: Self-pay | Admitting: Family Medicine

## 2016-11-18 VITALS — BP 138/88 | Ht 66.5 in | Wt 307.4 lb

## 2016-11-18 DIAGNOSIS — Z125 Encounter for screening for malignant neoplasm of prostate: Secondary | ICD-10-CM

## 2016-11-18 DIAGNOSIS — I10 Essential (primary) hypertension: Secondary | ICD-10-CM

## 2016-11-18 MED ORDER — SILDENAFIL CITRATE 20 MG PO TABS: ORAL_TABLET | ORAL | 3 refills | Status: DC

## 2016-11-18 MED ORDER — TRIAMTERENE-HCTZ 75-50 MG PO TABS: 1.0000 | ORAL_TABLET | Freq: Every day | ORAL | 6 refills | Status: DC

## 2016-11-18 MED ORDER — PHENTERMINE HCL 37.5 MG PO TABS: 37.5000 mg | ORAL_TABLET | Freq: Every day | ORAL | 2 refills | Status: DC

## 2016-11-18 NOTE — Progress Notes (Signed)
   Subjective:    Patient ID: [name], male    DOB: 08/04/1965, 51 y.o.   MRN: [id number]  Hypertension  This is a chronic problem. The current episode started more than 1 year ago. Pertinent negatives include no chest pain, headaches or shortness of breath. Risk factors for coronary artery disease include male gender and obesity. Treatments tried: triam/hctz. There are no compliance problems.    Patient having some anxiety also related to work Patient recently lost his father denies being depressed. PMH benign  Review of Systems  Constitutional: Negative for activity change, fatigue and fever.  Respiratory: Negative for cough and shortness of breath.   Cardiovascular: Negative for chest pain and leg swelling.  Neurological: Negative for headaches.       Objective:   Physical Exam  Constitutional: He appears well-nourished. No distress.  Cardiovascular: Normal rate, regular rhythm and normal heart sounds.   No murmur heard. Pulmonary/Chest: Effort normal and breath sounds normal. No respiratory distress.  Musculoskeletal: He exhibits no edema.  Lymphadenopathy:    He has no cervical adenopathy.  Neurological: He is alert.  Psychiatric: His behavior is normal.  Vitals reviewed.         Assessment & Plan:  Morbid obesity patient will consider bariatric surgery but once to try medication first to get this better he will follow-up in several months for wellness  HTN and tinea current medications check lab work  The importance of losing weight discuss in detail  Patient not depressed currently warning signs discussed.  Wellness within the next 2 months lab work ordered

## 2016-12-06 DIAGNOSIS — Z029 Encounter for administrative examinations, unspecified: Secondary | ICD-10-CM

## 2017-02-04 ENCOUNTER — Encounter: Payer: Self-pay | Admitting: Family Medicine

## 2017-02-04 ENCOUNTER — Ambulatory Visit (INDEPENDENT_AMBULATORY_CARE_PROVIDER_SITE_OTHER): Payer: Self-pay | Admitting: Family Medicine

## 2017-02-04 ENCOUNTER — Telehealth: Payer: Self-pay | Admitting: Family Medicine

## 2017-02-04 VITALS — BP 130/88 | Ht 66.5 in | Wt 288.1 lb

## 2017-02-04 DIAGNOSIS — I10 Essential (primary) hypertension: Secondary | ICD-10-CM

## 2017-02-04 DIAGNOSIS — K439 Ventral hernia without obstruction or gangrene: Secondary | ICD-10-CM

## 2017-02-04 DIAGNOSIS — Z1211 Encounter for screening for malignant neoplasm of colon: Secondary | ICD-10-CM

## 2017-02-04 DIAGNOSIS — Z0000 Encounter for general adult medical examination without abnormal findings: Secondary | ICD-10-CM

## 2017-02-04 MED ORDER — PHENTERMINE HCL 37.5 MG PO TABS: 37.5000 mg | ORAL_TABLET | Freq: Every day | ORAL | 1 refills | Status: DC

## 2017-02-04 NOTE — Progress Notes (Signed)
Subjective:    Patient ID: [name], male    DOB: 11-Dec-1965, 51 y.o.   MRN: [id number]  HPI The patient comes in today for a wellness visit. Patient has morbid obesity been counseled Back she is losing weight he is taking thin tear mean to help to lose weight. He denies any problems He does have blood pressure issues takes the diuretic does not take amlodipine Had recent lab work does not know the results but states he will send them to [place] Also has an area on his abdomen get some intermittent pain discomfort no vomiting or diarrhea with the been present for the past several weeks he thinks it may be a hernia   A review of their health history was completed.  A review of medications was also completed.  Any needed refills; Yes  Eating habits: Healthy some time forgets to eat.  Falls/  MVA accidents in past few months:None  Regular exercise: One day a week.  Specialist pt sees on regular basis: None  Preventative health issues were discussed.   Additional concerns: knot or hernia over umbilical area.   Review of Systems  Constitutional: Negative for activity change, appetite change and fever.  HENT: Negative for congestion and rhinorrhea.   Eyes: Negative for discharge.  Respiratory: Negative for cough and wheezing.   Cardiovascular: Negative for chest pain.  Gastrointestinal: Negative for abdominal pain, blood in stool and vomiting.  Genitourinary: Negative for difficulty urinating and frequency.  Musculoskeletal: Negative for neck pain.  Skin: Negative for rash.  Allergic/Immunologic: Negative for environmental allergies and food allergies.  Neurological: Negative for weakness and headaches.  Psychiatric/Behavioral: Negative for agitation.       Objective:   Physical Exam  Constitutional: He appears well-developed and well-nourished.  HENT:  Head: Normocephalic and atraumatic.  Right Ear: External ear normal.  Left Ear: External ear normal.  Nose: Nose  normal.  Mouth/Throat: Oropharynx is clear and moist.  Eyes: Pupils are equal, round, and reactive to light. EOM are normal.  Neck: Normal range of motion. Neck supple. No thyromegaly present.  Cardiovascular: Normal rate, regular rhythm and normal heart sounds.   No murmur heard. Pulmonary/Chest: Effort normal and breath sounds normal. No respiratory distress. He has no wheezes.  Abdominal: Soft. Bowel sounds are normal. He exhibits no distension and no mass. There is no tenderness.  Genitourinary: Penis normal.  Musculoskeletal: Normal range of motion. He exhibits no edema.  Lymphadenopathy:    He has no cervical adenopathy.  Neurological: He is alert. He exhibits normal muscle tone.  Skin: Skin is warm and dry. No erythema.  Psychiatric: He has a normal mood and affect. His behavior is normal. Judgment normal.   Prostate exam normal   Ventral hernia possible umbilical hernia noted not incarcerated    Assessment & Plan:  Patient will for lab work to [place] Blood pressure continue medication Ventral hernia referral to general surgery Morbid obesity may use Adipex for 2 more months Patient and try to lose weight Adult wellness-complete.wellness physical was conducted today. Importance of diet and exercise were discussed in detail. In addition to this a discussion regarding safety was also covered. We also reviewed over immunizations and gave recommendations regarding current immunization needed for age. In addition to this additional areas were also touched on including: Preventative health exams needed: Colonoscopy patient will look into the cost at [facility] if it's agreeable with him he will call us and we will refer him if not  he will call back and we'll do a stool tests for blood  Patient was advised yearly wellness exam

## 2017-02-04 NOTE — Telephone Encounter (Signed)
Pt dropped off a copy of his recent lab results. Results are in folder in office.

## 2017-02-07 ENCOUNTER — Encounter: Payer: Self-pay | Admitting: Family Medicine

## 2017-02-08 NOTE — Telephone Encounter (Signed)
Please let the patient know that I did review over his lab work. The insurance company testing overall look good. He is blessed with significant amount of good cholesterol-HDL-this protects his heart. Kidney function looked good. Liver look good. I do recommend that the patient have PSA testing. Please order this blood tests have the patient complete this to screen for prostate cancer which is standard testing for his age

## 2017-02-09 NOTE — Telephone Encounter (Signed)
Spoke with patient and informed him per Dr.Scott Luking-Dr.Scott reviewed over your lab work.The insurance company testing overall looks good. You are blessed with a significant amount of good Cholesterol. HDL this protects his heart. Kidney function looks good. Liver looks good. Recommend that PSA testing be done. This is a standard testing for his age to check for prostate cancer. Patient verbalized understanding and stated that he will get his PSA. Patient also stated that he would like to do plan b as far as colonoscopy because he can't afford a colonoscopy.

## 2017-02-09 NOTE — Telephone Encounter (Signed)
I rec iFBOT (this test can be done yearly as a screening for colon cancer)

## 2017-02-09 NOTE — Telephone Encounter (Signed)
Spoke with patient and informed him per Dr.Scott recommend iFOBT testing. Patient verbalized understanding.

## 2017-03-19 ENCOUNTER — Other Ambulatory Visit: Payer: Self-pay | Admitting: Family Medicine

## 2017-03-21 ENCOUNTER — Other Ambulatory Visit: Payer: Self-pay | Admitting: Family Medicine

## 2017-03-22 NOTE — Telephone Encounter (Signed)
May have this plus one additional refill

## 2017-08-05 ENCOUNTER — Ambulatory Visit: Payer: Self-pay | Admitting: Family Medicine

## 2017-09-20 ENCOUNTER — Other Ambulatory Visit: Payer: Self-pay | Admitting: Family Medicine

## 2017-10-28 ENCOUNTER — Ambulatory Visit: Payer: Self-pay | Admitting: Family Medicine

## 2017-10-28 ENCOUNTER — Encounter: Payer: Self-pay | Admitting: Family Medicine

## 2017-10-28 VITALS — BP 132/94 | Temp 99.0°F | Ht 66.5 in | Wt 300.8 lb

## 2017-10-28 DIAGNOSIS — R319 Hematuria, unspecified: Secondary | ICD-10-CM

## 2017-10-28 DIAGNOSIS — R5383 Other fatigue: Secondary | ICD-10-CM

## 2017-10-28 DIAGNOSIS — Z13220 Encounter for screening for lipoid disorders: Secondary | ICD-10-CM

## 2017-10-28 DIAGNOSIS — E785 Hyperlipidemia, unspecified: Secondary | ICD-10-CM

## 2017-10-28 DIAGNOSIS — Z85528 Personal history of other malignant neoplasm of kidney: Secondary | ICD-10-CM

## 2017-10-28 DIAGNOSIS — Z125 Encounter for screening for malignant neoplasm of prostate: Secondary | ICD-10-CM

## 2017-10-28 DIAGNOSIS — Z79899 Other long term (current) drug therapy: Secondary | ICD-10-CM

## 2017-10-28 LAB — POCT URINALYSIS DIPSTICK
Blood, UA: 250
Spec Grav, UA: 1.02 (ref 1.010–1.025)
pH, UA: 5 (ref 5.0–8.0)

## 2017-10-28 NOTE — Patient Instructions (Signed)
Must do CT scan and labs  Will need urology as well

## 2017-10-28 NOTE — Progress Notes (Signed)
   Subjective:    Patient ID: [name], male    DOB: July 31, 1965, 52 y.o.   MRN: [id number]  Hematuria  This is a new problem. The current episode started today. Pertinent negatives include no abdominal pain, dysuria, fever or nausea. (Renal cancer - 10 years ago)  This patient had kidney cancer back in 2009 had a partial nephrectomy at that time.  Follow-through for 5 years never had any reoccurrence.  Overall is been doing well until recently he saw some blood when he urinated denied any rectal bleeding denies abdominal pain flank pain suffers with morbid obesity denies substance abuse  Results for orders placed or performed in visit on 10/28/17  POCT urinalysis dipstick  Result Value Ref Range   Color, UA     Clarity, UA     Glucose, UA  Negative   Bilirubin, UA ++    Ketones, UA     Spec Grav, UA 1.020 1.010 - 1.025   Blood, UA 250    pH, UA 5.0 5.0 - 8.0   Protein, UA  Negative   Urobilinogen, UA  0.2 or 1.0 E.U./dL   Nitrite, UA     Leukocytes, UA  Negative   Appearance     Odor        Review of Systems  Constitutional: Negative for activity change, fatigue and fever.  HENT: Negative for congestion and rhinorrhea.   Respiratory: Negative for cough and shortness of breath.   Cardiovascular: Negative for chest pain and leg swelling.  Gastrointestinal: Negative for abdominal pain, diarrhea and nausea.  Genitourinary: Positive for hematuria. Negative for dysuria.  Neurological: Negative for weakness and headaches.  Psychiatric/Behavioral: Negative for behavioral problems.       Objective:   Physical Exam  Constitutional: He appears well-nourished. No distress.  Cardiovascular: Normal rate, regular rhythm and normal heart sounds.  No murmur heard. Pulmonary/Chest: Effort normal and breath sounds normal. No respiratory distress.  Abdominal: Soft. He exhibits no distension and no mass. There is no tenderness. There is no guarding.  Musculoskeletal: He exhibits no  edema.  Lymphadenopathy:    He has no cervical adenopathy.  Neurological: He is alert.  Psychiatric: His behavior is normal.  Vitals reviewed.         Assessment & Plan:  Hematuria Partial nephrectomy 2009 Followed 5 years Declared cancer free  Given the reoccurrence of hematuria need full work-up including CAT scan cystoscope referral to urology.  Patient to do PSA CBC metabolic 7 patient to follow-up in the summertime regarding  General health issues

## 2017-10-29 LAB — CBC WITH DIFFERENTIAL/PLATELET
Basophils Absolute: 0 10*3/uL (ref 0.0–0.2)
Basos: 0 %
EOS (ABSOLUTE): 0.1 10*3/uL (ref 0.0–0.4)
Eos: 2 %
Hematocrit: 48.3 % (ref 37.5–51.0)
Hemoglobin: 16.8 g/dL (ref 13.0–17.7)
Immature Grans (Abs): 0 10*3/uL (ref 0.0–0.1)
Immature Granulocytes: 0 %
Lymphocytes Absolute: 2.8 10*3/uL (ref 0.7–3.1)
Lymphs: 32 %
MCH: 28.1 pg (ref 26.6–33.0)
MCHC: 34.8 g/dL (ref 31.5–35.7)
MCV: 81 fL (ref 79–97)
Monocytes Absolute: 0.5 10*3/uL (ref 0.1–0.9)
Monocytes: 6 %
Neutrophils Absolute: 5.3 10*3/uL (ref 1.4–7.0)
Neutrophils: 60 %
Platelets: 221 10*3/uL (ref 150–450)
RBC: 5.98 x10E6/uL — ABNORMAL HIGH (ref 4.14–5.80)
RDW: 14.9 % (ref 12.3–15.4)
WBC: 8.8 10*3/uL (ref 3.4–10.8)

## 2017-10-29 LAB — BASIC METABOLIC PANEL
BUN/Creatinine Ratio: 13 (ref 9–20)
BUN: 13 mg/dL (ref 6–24)
CO2: 26 mmol/L (ref 20–29)
Calcium: 10.4 mg/dL — ABNORMAL HIGH (ref 8.7–10.2)
Chloride: 97 mmol/L (ref 96–106)
Creatinine, Ser: 1.04 mg/dL (ref 0.76–1.27)
GFR calc Af Amer: 96 mL/min/{1.73_m2} (ref >59)
GFR calc non Af Amer: 83 mL/min/{1.73_m2} (ref >59)
Glucose: 88 mg/dL (ref 65–99)
Potassium: 3.9 mmol/L (ref 3.5–5.2)
Sodium: 140 mmol/L (ref 134–144)

## 2017-10-29 LAB — PSA: Prostate Specific Ag, Serum: 0.6 ng/mL (ref 0.0–4.0)

## 2017-11-03 ENCOUNTER — Ambulatory Visit (HOSPITAL_COMMUNITY)
Admission: RE | Admit: 2017-11-03 | Discharge: 2017-11-03 | Disposition: A | Discharge status: Home / Self Care | Payer: Self-pay | Source: Ambulatory Visit | Attending: Family Medicine | Admitting: Family Medicine

## 2017-11-03 DIAGNOSIS — N400 Enlarged prostate without lower urinary tract symptoms: Secondary | ICD-10-CM | POA: Insufficient documentation

## 2017-11-03 DIAGNOSIS — R590 Localized enlarged lymph nodes: Secondary | ICD-10-CM | POA: Insufficient documentation

## 2017-11-03 DIAGNOSIS — K449 Diaphragmatic hernia without obstruction or gangrene: Secondary | ICD-10-CM | POA: Insufficient documentation

## 2017-11-03 DIAGNOSIS — R319 Hematuria, unspecified: Secondary | ICD-10-CM | POA: Insufficient documentation

## 2017-11-03 DIAGNOSIS — Z905 Acquired absence of kidney: Secondary | ICD-10-CM | POA: Insufficient documentation

## 2017-11-03 DIAGNOSIS — Z85528 Personal history of other malignant neoplasm of kidney: Secondary | ICD-10-CM | POA: Insufficient documentation

## 2017-11-03 DIAGNOSIS — R911 Solitary pulmonary nodule: Secondary | ICD-10-CM | POA: Insufficient documentation

## 2017-11-03 DIAGNOSIS — K5730 Diverticulosis of large intestine without perforation or abscess without bleeding: Secondary | ICD-10-CM | POA: Insufficient documentation

## 2017-11-03 MED ORDER — IOPAMIDOL (ISOVUE-300) INJECTION 61%
125.0000 mL | Freq: Once | INTRAVENOUS | Status: AC | PRN
  Administered 2017-11-03: 125 mL via INTRAVENOUS

## 2017-11-08 ENCOUNTER — Encounter (INDEPENDENT_AMBULATORY_CARE_PROVIDER_SITE_OTHER): Payer: Self-pay

## 2017-11-08 ENCOUNTER — Encounter: Payer: Self-pay | Admitting: Family Medicine

## 2017-11-10 ENCOUNTER — Other Ambulatory Visit: Payer: Self-pay | Admitting: Family Medicine

## 2017-11-10 ENCOUNTER — Telehealth: Payer: Self-pay | Admitting: Family Medicine

## 2017-11-10 DIAGNOSIS — R319 Hematuria, unspecified: Secondary | ICD-10-CM

## 2017-11-10 NOTE — Telephone Encounter (Signed)
Referral put in.

## 2017-11-10 NOTE — Telephone Encounter (Signed)
Please go ahead and set him up with urology-[facility].

## 2017-11-10 NOTE — Telephone Encounter (Signed)
Patient is requesting results to recent labwork. °

## 2017-11-10 NOTE — Telephone Encounter (Signed)
Discussed with pt his bloodwork results. I do not see a referral to urology in the system. He had ct scan last week and got results. Did you speak with urology or do you need me to call them.

## 2017-11-10 NOTE — Telephone Encounter (Signed)
Pt.notified

## 2017-11-25 ENCOUNTER — Other Ambulatory Visit: Payer: Self-pay | Admitting: Family Medicine

## 2017-11-25 DIAGNOSIS — R1084 Generalized abdominal pain: Secondary | ICD-10-CM

## 2017-11-30 ENCOUNTER — Encounter: Payer: Self-pay | Admitting: Family Medicine

## 2018-01-15 ENCOUNTER — Telehealth: Payer: Self-pay | Admitting: Family Medicine

## 2018-01-15 NOTE — Telephone Encounter (Signed)
Received notification from neurology regarding recent visit with the patient  This patient is due for a follow-up CT scan of abdomen pelvis with contrast as well as CT scan of chest without contrast  Reason for the follow-up scans is because of abdominal adenopathy and a pulmonary nodule  It does appear that a reminder was placed into the system for September  This patient will need the scans approximately September 6 with a recommended follow-up office visit within 3 to 5 days after the scan to discuss results  The purpose of this phone message is to make sure that this gets scheduled for early September thank you-if any questions please discuss with me

## 2018-01-16 NOTE — Telephone Encounter (Signed)
Tests are both ordered in Epic and awaiting pre certification and scheduling for first part of September.

## 2018-02-13 ENCOUNTER — Ambulatory Visit (HOSPITAL_COMMUNITY)
Admission: RE | Admit: 2018-02-13 | Discharge: 2018-02-13 | Disposition: A | Discharge status: Home / Self Care | Payer: Self-pay | Source: Ambulatory Visit | Attending: Family Medicine | Admitting: Family Medicine

## 2018-02-13 DIAGNOSIS — Z905 Acquired absence of kidney: Secondary | ICD-10-CM | POA: Insufficient documentation

## 2018-02-13 DIAGNOSIS — K439 Ventral hernia without obstruction or gangrene: Secondary | ICD-10-CM | POA: Insufficient documentation

## 2018-02-13 DIAGNOSIS — K5730 Diverticulosis of large intestine without perforation or abscess without bleeding: Secondary | ICD-10-CM | POA: Insufficient documentation

## 2018-02-13 DIAGNOSIS — R1084 Generalized abdominal pain: Secondary | ICD-10-CM | POA: Insufficient documentation

## 2018-02-13 DIAGNOSIS — R911 Solitary pulmonary nodule: Secondary | ICD-10-CM | POA: Insufficient documentation

## 2018-02-13 LAB — POCT I-STAT CREATININE: Creatinine, Ser: 0.9 mg/dL (ref 0.61–1.24)

## 2018-02-13 MED ORDER — IOPAMIDOL (ISOVUE-300) INJECTION 61%
125.0000 mL | Freq: Once | INTRAVENOUS | Status: AC | PRN
  Administered 2018-02-13: 120 mL via INTRAVENOUS

## 2018-02-16 ENCOUNTER — Telehealth: Payer: Self-pay | Admitting: Family Medicine

## 2018-02-16 ENCOUNTER — Other Ambulatory Visit: Payer: Self-pay

## 2018-02-16 MED ORDER — TRIAMTERENE-HCTZ 75-50 MG PO TABS: 1.0000 | ORAL_TABLET | Freq: Every day | ORAL | 2 refills | Status: DC

## 2018-02-16 NOTE — Telephone Encounter (Signed)
Medication sent in and patient is aware.  

## 2018-02-16 NOTE — Telephone Encounter (Signed)
Pt requesting refill on triamterene-hydrochlorothiazide (MAXZIDE) 75-50 MG tablet.  Please send to [facility] on [address], [place]

## 2018-02-20 ENCOUNTER — Telehealth: Payer: Self-pay | Admitting: Family Medicine

## 2018-02-20 NOTE — Telephone Encounter (Signed)
Spoke with the pt and she states he has a hx of hematuria and was referred to Urology. He saw some blood in urine yesterday he is not running a temp,but he has a cough also. He states he had seen Urology in May for the hematuria and they told him they did not see anything while he was there. I advised that we did not have any available appt for today and spoke with Dr.Scott whom states to check and is see if a lot of blood regularly or just intermittent. Per pt it is intermittent and we will see the pt tomorrow morning to re evaluate.

## 2018-02-20 NOTE — Telephone Encounter (Signed)
Triage-Patient states he been passing blood since Sunday and bad cough

## 2018-02-21 ENCOUNTER — Ambulatory Visit (INDEPENDENT_AMBULATORY_CARE_PROVIDER_SITE_OTHER): Payer: Self-pay | Admitting: Family Medicine

## 2018-02-21 ENCOUNTER — Telehealth: Payer: Self-pay | Admitting: Family Medicine

## 2018-02-21 ENCOUNTER — Encounter: Payer: Self-pay | Admitting: Family Medicine

## 2018-02-21 VITALS — BP 132/82 | Temp 98.5°F | Ht 66.5 in | Wt 313.6 lb

## 2018-02-21 DIAGNOSIS — I10 Essential (primary) hypertension: Secondary | ICD-10-CM

## 2018-02-21 DIAGNOSIS — R319 Hematuria, unspecified: Secondary | ICD-10-CM

## 2018-02-21 LAB — POCT URINALYSIS DIPSTICK
Blood, UA: NEGATIVE
Leukocytes, UA: NEGATIVE
Spec Grav, UA: 1.02 (ref 1.010–1.025)
pH, UA: 6 (ref 5.0–8.0)

## 2018-02-21 MED ORDER — PHENTERMINE HCL 37.5 MG PO TABS: ORAL_TABLET | ORAL | 2 refills | Status: DC

## 2018-02-21 MED ORDER — AZITHROMYCIN 250 MG PO TABS: ORAL_TABLET | ORAL | 0 refills | Status: DC

## 2018-02-21 NOTE — Telephone Encounter (Signed)
Patient brought back in form he spoke with you at his appointment today in your box for you to review and fill in,date,sign.

## 2018-02-21 NOTE — Telephone Encounter (Signed)
The form was filled out to the best of my ability please fill in the bottom part.  Please forward to the patient

## 2018-02-21 NOTE — Progress Notes (Addendum)
   Subjective:    Patient ID: [name], male    DOB: November 02, 1965, 52 y.o.   MRN: [id number]  HPI Patient relates intermittent blood in the urine also relates burning and discomfort at times Patient arrive with blood in urine since May.  Very nice patient Having some difficult issues Intermittent hematuria Seen by urology earlier this year and had a cystoscope done was told they did not see any cancer his CAT scan did not show any cancer his CAT scan does show possible panniculitis we will need to talk to gastroenterology about that  He has morbid obesity he is trying to lose weight try to watch his diet he does take his diuretic on a regular basis he is interested in trying to phenteramine for a short span of time to help him  Patient also having a cough for 2 weeks.  Intermittent coughing the past couple weeks denies high fever chills sweats wheezing difficulty breathing  Review of Systems  Constitutional: Negative for activity change, chills and fever.  HENT: Positive for congestion and rhinorrhea. Negative for ear pain.   Eyes: Negative for discharge.  Respiratory: Positive for cough. Negative for wheezing.   Cardiovascular: Negative for chest pain.  Gastrointestinal: Negative for nausea and vomiting.  Genitourinary: Positive for hematuria. Negative for flank pain and frequency.  Musculoskeletal: Negative for arthralgias.       Objective:   Physical Exam  Constitutional: He appears well-developed.  HENT:  Head: Normocephalic.  Mouth/Throat: Oropharynx is clear and moist. No oropharyngeal exudate.  Neck: Normal range of motion.  Cardiovascular: Normal rate, regular rhythm and normal heart sounds.  No murmur heard. Pulmonary/Chest: Effort normal and breath sounds normal. He has no wheezes.  Abdominal: Soft. He exhibits no distension. There is no tenderness.  Lymphadenopathy:    He has no cervical adenopathy.  Neurological: He exhibits normal muscle tone.  Skin: Skin is  warm and dry.  Nursing note and vitals reviewed.    Patient encouraged to do wellness exam in the fall     Assessment & Plan:  Hematuria earlier this week with pain could have been a kidney stone No longer having hematuria Urine is clear Morbid obesity phenteramine 3 months worth sent in monitor blood pressure Blood pressure good control continue medication Upper respiratory illness Zithromax prescribed Preventative health maintenance physical recommended for this fall Will try to get records from [facility] urology regarding cystoscope and evaluation of this patient  Patient does have morbid obesity he is trying to lose weight Overall health is good No recurrence of cancer

## 2018-02-28 ENCOUNTER — Telehealth: Payer: Self-pay | Admitting: Family Medicine

## 2018-02-28 MED ORDER — DOXYCYCLINE HYCLATE 100 MG PO TABS: 100.0000 mg | ORAL_TABLET | Freq: Two times a day (BID) | ORAL | 0 refills | Status: DC

## 2018-02-28 MED ORDER — BENZONATATE 100 MG PO CAPS: 100.0000 mg | ORAL_CAPSULE | Freq: Three times a day (TID) | ORAL | 0 refills | Status: DC | PRN

## 2018-02-28 NOTE — Telephone Encounter (Signed)
Patient was seen 9/24 and states still has bad cough and wondering if you can call in something for it

## 2018-02-28 NOTE — Telephone Encounter (Signed)
Prescriptions sent electronically to pharmacy. Patient notified. °

## 2018-02-28 NOTE — Telephone Encounter (Signed)
Patient was given Z pack and finished it yesterday but still has cough and congestion. Please advise?

## 2018-02-28 NOTE — Telephone Encounter (Signed)
Doxy 100 one bid 7 days Tessalon 100 mg , one tid prn cough, #21

## 2018-03-03 ENCOUNTER — Telehealth: Payer: Self-pay | Admitting: *Deleted

## 2018-03-03 NOTE — Telephone Encounter (Signed)
Seen 02/21/18 for hematuria and pt states he discussed his cough with dr Nicki Reaper at that visit. Telephone note on 10/1 states he finished zpack but still had cough and congestion. Dr Nicki Reaper sent in doxy 100mg  bid for 7 days. Pt states he has fever that comes and goes and a dry non ending cough. Some sob when he is coughing. Tessalon not helping much. Took robitussin last night and it helped him sleep.   [facility]

## 2018-03-05 NOTE — Telephone Encounter (Signed)
Call pt, ask how cough is doing now that he has taken nearly full round of doxy

## 2018-03-06 MED ORDER — HYDROCODONE-HOMATROPINE 5-1.5 MG/5ML PO SYRP: ORAL_SOLUTION | ORAL | 0 refills | Status: DC

## 2018-03-06 NOTE — Telephone Encounter (Signed)
Hycodan 3 oz one twpn qhs prn cough

## 2018-03-06 NOTE — Telephone Encounter (Signed)
Patient states the cough is some better,but not completely gone as he was up most of the night with the cough. She states he has no fever, no production,no congestion.

## 2018-03-06 NOTE — Telephone Encounter (Signed)
Medication sent to requested pharmacy. Pt is aware.

## 2018-03-12 ENCOUNTER — Telehealth: Payer: Self-pay | Admitting: Family Medicine

## 2018-03-12 NOTE — Telephone Encounter (Signed)
Patient's notes from seeing [facility] in July confirmed that the cystoscope was negative  Danae Chen- these notes from [facility] in July were accidentally damaged-please connect with them and ask them to send [place] a new copy for our records thank you

## 2018-03-13 NOTE — Telephone Encounter (Signed)
Copy of this report is in media from July that they sent over the one you had was a copy .

## 2018-03-13 NOTE — Telephone Encounter (Signed)
Great thank you!

## 2018-03-20 ENCOUNTER — Ambulatory Visit: Payer: Self-pay | Admitting: Family Medicine

## 2018-03-22 ENCOUNTER — Ambulatory Visit (INDEPENDENT_AMBULATORY_CARE_PROVIDER_SITE_OTHER): Payer: Self-pay | Admitting: Family Medicine

## 2018-03-22 ENCOUNTER — Encounter: Payer: Self-pay | Admitting: Family Medicine

## 2018-03-22 VITALS — BP 132/82 | Ht 66.5 in | Wt 315.6 lb

## 2018-03-22 DIAGNOSIS — E65 Localized adiposity: Secondary | ICD-10-CM

## 2018-03-22 DIAGNOSIS — R911 Solitary pulmonary nodule: Secondary | ICD-10-CM

## 2018-03-22 DIAGNOSIS — E785 Hyperlipidemia, unspecified: Secondary | ICD-10-CM

## 2018-03-22 NOTE — Patient Instructions (Signed)
CT abd in Jan 2020  CT chest in March 2020  Recheck here in 6 months

## 2018-03-22 NOTE — Progress Notes (Signed)
   Subjective:    Patient ID: [name], male    DOB: [birth year], 52 y.o.   MRN: [id number]  HPI Patient arrives for a follow up visit. Patient states his chronic cough is doing much better since his last visit and he he has had no further blood in urine and is doing well and trying to eat right. The patient relates he is trying to do the best he can try to watch his diet he states he on most days feels pretty good denies any hematuria he has had this thoroughly worked up  Review of Systems Is currently denies any high fever chills but he does relate some congestion and cough denies nausea vomiting diarrhea    Objective:   Physical Exam Lungs clear respiratory rate normal heart regular abdomen soft obese with Please see discussion below 15 minutes was spent with patient today discussing healthcare issues which they came.  More than 50% of this visit-total duration of visit-was spent in counseling and coordination of care.  Please see diagnosis regarding the focus of this coordination and care       Assessment & Plan:  Morbid obesity patient is working hard to try to watch diet exercise and lose weight  Hematuria this is resolved he has had a cystoscope by urology and had a CAT scan did not show any tumor  Abdominal panniculitis patient will need follow-up CT scan of the abdomen in January  Pulmonary nodule patient will need a follow-up CT scan of the chest in March.  Patient will follow-up in approximately 6 months sooner if any problems  Elevated calcium recheck this level  Hyperlipidemia recheck this

## 2018-03-24 NOTE — Progress Notes (Signed)
CT scan changed to be due in January and Chest scan changed to be due in March

## 2018-03-28 LAB — LIPID PANEL
Chol/HDL Ratio: 2.9 ratio (ref 0.0–5.0)
Cholesterol, Total: 165 mg/dL (ref 100–199)
HDL: 57 mg/dL (ref >39)
LDL Calculated: 89 mg/dL (ref 0–99)
Triglycerides: 94 mg/dL (ref 0–149)
VLDL Cholesterol Cal: 19 mg/dL (ref 5–40)

## 2018-03-28 LAB — BASIC METABOLIC PANEL
BUN/Creatinine Ratio: 14 (ref 9–20)
BUN: 15 mg/dL (ref 6–24)
CO2: 26 mmol/L (ref 20–29)
Calcium: 9.4 mg/dL (ref 8.7–10.2)
Chloride: 102 mmol/L (ref 96–106)
Creatinine, Ser: 1.05 mg/dL (ref 0.76–1.27)
GFR calc Af Amer: 95 mL/min/{1.73_m2} (ref >59)
GFR calc non Af Amer: 82 mL/min/{1.73_m2} (ref >59)
Glucose: 86 mg/dL (ref 65–99)
Potassium: 4.3 mmol/L (ref 3.5–5.2)
Sodium: 141 mmol/L (ref 134–144)

## 2018-05-15 ENCOUNTER — Other Ambulatory Visit: Payer: Self-pay | Admitting: Family Medicine

## 2018-06-13 ENCOUNTER — Other Ambulatory Visit: Payer: Self-pay | Admitting: *Deleted

## 2018-06-13 DIAGNOSIS — R599 Enlarged lymph nodes, unspecified: Secondary | ICD-10-CM

## 2018-06-20 ENCOUNTER — Ambulatory Visit (HOSPITAL_COMMUNITY)
Admission: RE | Admit: 2018-06-20 | Discharge: 2018-06-20 | Disposition: A | Discharge status: Home / Self Care | Payer: Self-pay | Source: Ambulatory Visit | Attending: Family Medicine | Admitting: Family Medicine

## 2018-06-20 DIAGNOSIS — R599 Enlarged lymph nodes, unspecified: Secondary | ICD-10-CM | POA: Insufficient documentation

## 2018-06-20 LAB — POCT I-STAT CREATININE: Creatinine, Ser: 1.1 mg/dL (ref 0.61–1.24)

## 2018-06-20 MED ORDER — IOPAMIDOL (ISOVUE-300) INJECTION 61%
100.0000 mL | Freq: Once | INTRAVENOUS | Status: AC | PRN
  Administered 2018-06-20: 100 mL via INTRAVENOUS

## 2018-08-23 ENCOUNTER — Other Ambulatory Visit: Payer: Self-pay | Admitting: *Deleted

## 2018-08-23 DIAGNOSIS — R918 Other nonspecific abnormal finding of lung field: Secondary | ICD-10-CM

## 2018-08-23 DIAGNOSIS — R911 Solitary pulmonary nodule: Secondary | ICD-10-CM

## 2018-10-27 ENCOUNTER — Other Ambulatory Visit: Payer: Self-pay

## 2018-10-27 ENCOUNTER — Ambulatory Visit (INDEPENDENT_AMBULATORY_CARE_PROVIDER_SITE_OTHER): Payer: PRIVATE HEALTH INSURANCE | Admitting: Family Medicine

## 2018-10-27 DIAGNOSIS — Z125 Encounter for screening for malignant neoplasm of prostate: Secondary | ICD-10-CM

## 2018-10-27 DIAGNOSIS — I10 Essential (primary) hypertension: Secondary | ICD-10-CM

## 2018-10-27 DIAGNOSIS — R0683 Snoring: Secondary | ICD-10-CM | POA: Diagnosis not present

## 2018-10-27 NOTE — Progress Notes (Signed)
   Subjective:    Patient ID: [name], male    DOB: 02-28-1966, 53 y.o.   MRN: [id number]  HPI  Patient wants to discuss options for snoring. Patient states he had a sleep study in the past and has a CPAP that he just started using it since getting married.    Patient states the [employer] people just made him do a titration but he has not heard anything from them. Patient states he had heard their may be some surgery to help with snoring and was looking at other options beyond CPAP  Virtual Visit via Video Note  I connected with [name] on 10/27/18 at  2:00 PM EDT by a video enabled telemedicine application and verified that I am speaking with the correct person using two identifiers.  Location: Patient: home Provider: office   I discussed the limitations of evaluation and management by telemedicine and the availability of in person appointments. The patient expressed understanding and agreed to proceed.  History of Present Illness:    Observations/Objective:   Assessment and Plan:   Follow Up Instructions:    I discussed the assessment and treatment plan with the patient. The patient was provided an opportunity to ask questions and all were answered. The patient agreed with the plan and demonstrated an understanding of the instructions.   The patient was advised to call back or seek an in-person evaluation if the symptoms worsen or if the condition fails to improve as anticipated.  I provided 15 minutes of non-face-to-face time during this encounter.     Review of Systems     Objective:   Physical Exam   Patient had virtual visit Appears to be in no distress Atraumatic Neuro able to relate and oriented No apparent resp distress Color normal      Assessment & Plan:  Significant obesity patient was doing the best he can in getting this under control.  Trying to watch diet stay active  Blood pressure he states his been doing well he will be doing  again first involved follow-up and wellness checkup this summer  Snoring probably related sleep apnea he states he did have a sleep study and titration study we will try to get the results of this and send it to [place] he states he is currently using a home machine.  I did discuss with him that we could refer him to ENT for further evaluation but not sure surgery would necessarily cure his problem patient states he will wait till he comes in and discuss it with [place] in person

## 2018-10-31 ENCOUNTER — Other Ambulatory Visit: Payer: Self-pay

## 2018-10-31 ENCOUNTER — Ambulatory Visit (HOSPITAL_COMMUNITY)
Admission: RE | Admit: 2018-10-31 | Discharge: 2018-10-31 | Disposition: A | Discharge status: Home / Self Care | Payer: Self-pay | Source: Ambulatory Visit | Attending: Family Medicine | Admitting: Family Medicine

## 2018-10-31 DIAGNOSIS — R911 Solitary pulmonary nodule: Secondary | ICD-10-CM | POA: Insufficient documentation

## 2018-10-31 DIAGNOSIS — R918 Other nonspecific abnormal finding of lung field: Secondary | ICD-10-CM | POA: Insufficient documentation

## 2018-11-20 ENCOUNTER — Other Ambulatory Visit: Payer: Self-pay | Admitting: Family Medicine

## 2018-11-30 ENCOUNTER — Other Ambulatory Visit: Payer: Self-pay | Admitting: *Deleted

## 2018-11-30 DIAGNOSIS — R599 Enlarged lymph nodes, unspecified: Secondary | ICD-10-CM

## 2018-12-05 LAB — BASIC METABOLIC PANEL
BUN/Creatinine Ratio: 15 (ref 9–20)
BUN: 15 mg/dL (ref 6–24)
CO2: 26 mmol/L (ref 20–29)
Calcium: 9.8 mg/dL (ref 8.7–10.2)
Chloride: 100 mmol/L (ref 96–106)
Creatinine, Ser: 0.98 mg/dL (ref 0.76–1.27)
GFR calc Af Amer: 102 mL/min/{1.73_m2} (ref >59)
GFR calc non Af Amer: 88 mL/min/{1.73_m2} (ref >59)
Glucose: 88 mg/dL (ref 65–99)
Potassium: 4.2 mmol/L (ref 3.5–5.2)
Sodium: 142 mmol/L (ref 134–144)

## 2018-12-05 LAB — PSA: Prostate Specific Ag, Serum: 0.7 ng/mL (ref 0.0–4.0)

## 2018-12-20 ENCOUNTER — Ambulatory Visit (HOSPITAL_COMMUNITY)
Admission: RE | Admit: 2018-12-20 | Discharge: 2018-12-20 | Disposition: A | Discharge status: Home / Self Care | Payer: No Typology Code available for payment source | Source: Ambulatory Visit | Attending: Family Medicine | Admitting: Family Medicine

## 2018-12-20 ENCOUNTER — Other Ambulatory Visit: Payer: Self-pay

## 2018-12-20 DIAGNOSIS — R599 Enlarged lymph nodes, unspecified: Secondary | ICD-10-CM | POA: Diagnosis present

## 2018-12-20 MED ORDER — IOHEXOL 300 MG/ML  SOLN
100.0000 mL | Freq: Once | INTRAMUSCULAR | Status: AC | PRN
  Administered 2018-12-20: 100 mL via INTRAVENOUS

## 2018-12-25 ENCOUNTER — Telehealth: Payer: Self-pay | Admitting: *Deleted

## 2018-12-25 NOTE — Telephone Encounter (Signed)
Called pt and gave ct results and then he asked about getting prescription for weight loss med. He use to take phentermine. Last prescribed sept 2019. Pt states he did well with this med but if you preferred another weight loss med he is fine with that too.  [facility].

## 2018-12-27 ENCOUNTER — Telehealth: Payer: Self-pay | Admitting: Family Medicine

## 2018-12-27 DIAGNOSIS — E785 Hyperlipidemia, unspecified: Secondary | ICD-10-CM

## 2018-12-27 DIAGNOSIS — Z114 Encounter for screening for human immunodeficiency virus [HIV]: Secondary | ICD-10-CM

## 2018-12-27 DIAGNOSIS — Z131 Encounter for screening for diabetes mellitus: Secondary | ICD-10-CM

## 2018-12-27 NOTE — Telephone Encounter (Signed)
Last labs 12/04/18 bmp and psa

## 2018-12-27 NOTE — Telephone Encounter (Signed)
Patient will need to do a in person visit before we prescribe this is all to perspective recommend follow-up by fall

## 2018-12-27 NOTE — Telephone Encounter (Signed)
He recently had PSA kidney function all that looks good I would recommend cholesterol, fasting glucose, HIV antibody per CDC guideline Diagnosis screening hyperlipidemia screening diabetes, CDC guideline for the HIV antibody

## 2018-12-27 NOTE — Telephone Encounter (Signed)
Blood work ordered in Epic. Left message to return call to notify patient. 

## 2018-12-27 NOTE — Telephone Encounter (Signed)
Discussed with pt. Pt verbalized understanding and wanted to go ahead and schedule appt. Transferred to front to schedule.

## 2018-12-27 NOTE — Telephone Encounter (Signed)
Patient has physical on 8/26 and needing labs done.

## 2018-12-28 NOTE — Telephone Encounter (Signed)
Pt returned call and informed labs are ordered and fasting .

## 2019-01-20 LAB — LIPID PANEL
Chol/HDL Ratio: 3.1 ratio (ref 0.0–5.0)
Cholesterol, Total: 190 mg/dL (ref 100–199)
HDL: 62 mg/dL (ref >39)
LDL Calculated: 115 mg/dL — ABNORMAL HIGH (ref 0–99)
Triglycerides: 67 mg/dL (ref 0–149)
VLDL Cholesterol Cal: 13 mg/dL (ref 5–40)

## 2019-01-20 LAB — GLUCOSE, RANDOM: Glucose: 98 mg/dL (ref 65–99)

## 2019-01-20 LAB — HIV ANTIBODY (ROUTINE TESTING W REFLEX): HIV Screen 4th Generation wRfx: NONREACTIVE

## 2019-01-24 ENCOUNTER — Encounter: Payer: Self-pay | Admitting: Family Medicine

## 2019-01-24 ENCOUNTER — Telehealth: Payer: Self-pay | Admitting: *Deleted

## 2019-01-24 ENCOUNTER — Ambulatory Visit (INDEPENDENT_AMBULATORY_CARE_PROVIDER_SITE_OTHER): Payer: PRIVATE HEALTH INSURANCE | Admitting: Family Medicine

## 2019-01-24 ENCOUNTER — Other Ambulatory Visit: Payer: Self-pay

## 2019-01-24 VITALS — BP 148/100 | Temp 97.9°F | Ht 65.25 in | Wt 312.0 lb

## 2019-01-24 DIAGNOSIS — Z1211 Encounter for screening for malignant neoplasm of colon: Secondary | ICD-10-CM | POA: Diagnosis not present

## 2019-01-24 DIAGNOSIS — Z0000 Encounter for general adult medical examination without abnormal findings: Secondary | ICD-10-CM | POA: Diagnosis not present

## 2019-01-24 MED ORDER — AMLODIPINE BESYLATE 5 MG PO TABS: ORAL_TABLET | ORAL | 1 refills | Status: DC

## 2019-01-24 MED ORDER — TRIAMTERENE-HCTZ 75-50 MG PO TABS: 1.0000 | ORAL_TABLET | Freq: Every day | ORAL | 1 refills | Status: DC

## 2019-01-24 NOTE — Patient Instructions (Signed)

## 2019-01-24 NOTE — Telephone Encounter (Signed)
Patient notified and stated he wanted Dr Nicki Reaper to know he forgot to take any of his medications this am- when he got home they were all still on the counter

## 2019-01-24 NOTE — Telephone Encounter (Signed)
Patient states he will take medication as directed and follow up at appt 03/07/2019

## 2019-01-24 NOTE — Progress Notes (Signed)
Subjective:    Patient ID: [name], male    DOB: [birth year], 53 y.o.   MRN: [id number]  HPI The patient comes in today for a wellness visit. Patient does relate headaches intermittently once or twice a month times throbbing other times feels tight no nausea no vomiting no double vision does not wake him up at night comes and goes without difficulty  Patient has morbid obesity he has been trying to watch diet try and lose weight exercising on a regular basis he is trying to eat healthier.  He understands the need to get this under better control  Occasionally has erectile dysfunction lately has not had to use any medicines  Patient try to be safe to minimize risk of c COVID A review of their health history was completed.  A review of medications was also completed.  Any needed refills; triamterene/hctz and  phentermine  Eating habits: health conscious  Falls/  MVA accidents in past few months: none  Regular exercise: walking a few days a week  Specialist pt sees on regular basis: none  Preventative health issues were discussed.   Additional concerns: headaches. Off and on for about one month.   Smells a chemical smell for about one month that nobody else around him smells.    Review of Systems  Constitutional: Negative for activity change, appetite change and fever.  HENT: Negative for congestion and rhinorrhea.   Eyes: Negative for discharge.  Respiratory: Negative for cough and wheezing.   Cardiovascular: Negative for chest pain.  Gastrointestinal: Negative for abdominal pain, blood in stool and vomiting.  Genitourinary: Negative for difficulty urinating and frequency.  Musculoskeletal: Negative for neck pain.  Skin: Negative for rash.  Allergic/Immunologic: Negative for environmental allergies and food allergies.  Neurological: Negative for weakness and headaches.  Psychiatric/Behavioral: Negative for agitation.       Objective:   Physical Exam  Constitutional:      Appearance: He is well-developed.  HENT:     Head: Normocephalic and atraumatic.     Right Ear: External ear normal.     Left Ear: External ear normal.     Nose: Nose normal.  Eyes:     Pupils: Pupils are equal, round, and reactive to light.  Neck:     Musculoskeletal: Normal range of motion and neck supple.     Thyroid: No thyromegaly.  Cardiovascular:     Rate and Rhythm: Normal rate and regular rhythm.     Heart sounds: Normal heart sounds. No murmur.  Pulmonary:     Effort: Pulmonary effort is normal. No respiratory distress.     Breath sounds: Normal breath sounds. No wheezing.  Abdominal:     General: Bowel sounds are normal. There is no distension.     Palpations: Abdomen is soft. There is no mass.     Tenderness: There is no abdominal tenderness.  Genitourinary:    Penis: Normal.   Musculoskeletal: Normal range of motion.  Lymphadenopathy:     Cervical: No cervical adenopathy.  Skin:    General: Skin is warm and dry.     Findings: No erythema.  Neurological:     Mental Status: He is alert.     Motor: No abnormal muscle tone.  Psychiatric:        Behavior: Behavior normal.        Judgment: Judgment normal.     Prostate exam normal Results for orders placed or performed in visit on 12/27/18  Lipid panel  Result Value Ref Range   Cholesterol, Total 190 100 - 199 mg/dL   Triglycerides 67 0 - 149 mg/dL   HDL 62 >39 mg/dL   VLDL Cholesterol Cal 13 5 - 40 mg/dL   LDL Calculated 115 (H) 0 - 99 mg/dL   Chol/HDL Ratio 3.1 0.0 - 5.0 ratio  Glucose, random  Result Value Ref Range   Glucose 98 65 - 99 mg/dL  HIV Antibody (routine testing w rflx)  Result Value Ref Range   HIV Screen 4th Generation wRfx Non Reactive Non Reactive        Assessment & Plan:  Adult wellness-complete.wellness physical was conducted today. Importance of diet and exercise were discussed in detail.  In addition to this a discussion regarding safety was also covered.  We also reviewed over immunizations and gave recommendations regarding current immunization needed for age.  In addition to this additional areas were also touched on including: Preventative health exams needed:  Colonoscopy colonoscopy recommended patient defers he will do stool test for blood  Patient was advised yearly wellness exam  The patient's BMI is calculated.  The patient does have obesity.  The patient does try to some degree staying active and watching diet.  It is in the vital signs and acknowledged.  It is above the recommended BMI for the patient's height and weight.  The patient has been counseled regarding healthy diet, restricted portions, avoiding excessive carbohydrates/sugary foods, and increase physical activity as health permits.  It is in the patient's best interest to lower the risk of secondary illness including heart disease strokes and cancer by losing weight.  The patient acknowledges this information. He is working hard at trying to lose weight  HTN- Patient was seen today as part of a visit regarding hypertension. The importance of healthy diet and regular physical activity was discussed. The importance of compliance with medications discussed.  Ideal goal is to keep blood pressure low elevated levels certainly below [id number] when possible.  The patient was counseled that keeping blood pressure under control lessen his risk of complications.  The importance of regular follow-ups was discussed with the patient.  Low-salt diet such as DASH recommended.  Regular physical activity was recommended as well.  Patient was advised to keep regular follow-ups. Subpar control of blood pressure and amlodipine recheck within 6 weeks

## 2019-01-24 NOTE — Telephone Encounter (Signed)
1.  Get back in the habit of taking the medicines on a regular basis #2 I still recommend a follow-up but we can recommend doing a follow-up in 3 weeks regarding his blood pressure rather than 6 weeks  I do not believe phenteramine would be a good idea because it can contribute to blood pressure issues

## 2019-01-24 NOTE — Telephone Encounter (Signed)
Pt seen today and was not sure if you was going to prescribe the phentermine or not. Please advise

## 2019-01-24 NOTE — Telephone Encounter (Signed)
With his blood pressure being elevated I definitely do not recommend phenteramine his best approach is healthy eating and keep doing the walking he is doing We will see him at his follow-up for blood pressure recheck since we initiated additional medicine today

## 2019-02-01 ENCOUNTER — Other Ambulatory Visit: Payer: Self-pay | Admitting: *Deleted

## 2019-02-01 DIAGNOSIS — Z1211 Encounter for screening for malignant neoplasm of colon: Secondary | ICD-10-CM

## 2019-02-01 DIAGNOSIS — Z0000 Encounter for general adult medical examination without abnormal findings: Secondary | ICD-10-CM

## 2019-02-01 LAB — IFOBT (OCCULT BLOOD): IFOBT: NEGATIVE

## 2019-03-07 ENCOUNTER — Other Ambulatory Visit: Payer: Self-pay

## 2019-03-07 ENCOUNTER — Ambulatory Visit (INDEPENDENT_AMBULATORY_CARE_PROVIDER_SITE_OTHER): Payer: PRIVATE HEALTH INSURANCE | Admitting: Family Medicine

## 2019-03-07 VITALS — BP 138/88 | Temp 97.7°F | Wt 323.6 lb

## 2019-03-07 DIAGNOSIS — Z1211 Encounter for screening for malignant neoplasm of colon: Secondary | ICD-10-CM

## 2019-03-07 DIAGNOSIS — Z23 Encounter for immunization: Secondary | ICD-10-CM

## 2019-03-07 DIAGNOSIS — I10 Essential (primary) hypertension: Secondary | ICD-10-CM | POA: Diagnosis not present

## 2019-03-07 NOTE — Patient Instructions (Signed)

## 2019-03-07 NOTE — Progress Notes (Signed)
   Subjective:    Patient ID: [name], male    DOB: April 07, 1966, 53 y.o.   MRN: [id number]  Hypertension This is a chronic problem. Pertinent negatives include no chest pain, headaches or shortness of breath. Treatments tried: norvasc, maxzide. Compliance problems: takes meds everyday but did forget this morning, will start back walking, states he is doing better with diet.   Does state he takes his medicine but he forgot to take it earlier today otherwise he does a good job of taking it Thinks he passed a kidney stone. Felt like he had needles going through him about 3 weeks ago when he was urinating. No pain since then.  Patient denies flank pain denies hematuria denies high fever chills sweats   Review of Systems  Constitutional: Negative for diaphoresis and fatigue.  HENT: Negative for congestion and rhinorrhea.   Respiratory: Negative for cough and shortness of breath.   Cardiovascular: Negative for chest pain and leg swelling.  Gastrointestinal: Negative for abdominal pain and diarrhea.  Skin: Negative for color change and rash.  Neurological: Negative for dizziness and headaches.  Psychiatric/Behavioral: Negative for behavioral problems and confusion.       Objective:   Physical Exam Vitals signs reviewed.  Constitutional:      General: He is not in acute distress. HENT:     Head: Normocephalic and atraumatic.  Eyes:     General:        Right eye: No discharge.        Left eye: No discharge.  Neck:     Trachea: No tracheal deviation.  Cardiovascular:     Rate and Rhythm: Normal rate and regular rhythm.     Heart sounds: Normal heart sounds. No murmur.  Pulmonary:     Effort: Pulmonary effort is normal. No respiratory distress.     Breath sounds: Normal breath sounds.  Lymphadenopathy:     Cervical: No cervical adenopathy.  Skin:    General: Skin is warm and dry.  Neurological:     Mental Status: He is alert.     Coordination: Coordination normal.   Psychiatric:        Behavior: Behavior normal.   There is no sign of any swelling in his legs Blood pressure was checked best reading 138/88  Flu shot today GI referral for colonoscopy Will do lab work in the spring     Assessment & Plan:  HTN patient is doing okay with the blood pressure and he needs to take his medicine regular basis in the evening time watch salt in the diet next time he comes bring his electronic cuff with him follow-up within 4 to 6 months  Morbid obesity very important for the patient to watch his diet stay physically active and try to lose some weight

## 2019-03-14 ENCOUNTER — Encounter: Payer: Self-pay | Admitting: Family Medicine

## 2019-03-21 ENCOUNTER — Encounter: Payer: Self-pay | Admitting: Internal Medicine

## 2019-03-26 IMAGING — CT CT ABDOMEN WO/W CM
3 of 12 series · 11 of 46 positions shown, 17 images · IV contrast (Isovue)
Comparison: 12/12/2007 CT abdomen/pelvis.

CLINICAL DATA: Hematuria for 1 week. History of right partial
nephrectomy in 9449 for renal cell carcinoma.

EXAM:
CT ABDOMEN WITHOUT AND WITH CONTRAST
TECHNIQUE: Multidetector CT imaging of the abdomen was performed following the
standard protocol before and following the bolus administration of
intravenous contrast.
CONTRAST:  125mL F52FEX-188 IOPAMIDOL (F52FEX-188) INJECTION 61%

[Series 2: axial pre · axial · non-contrast · 0.88mm/px · z∈[+1018,+1084]mm · 2 of 90 slices shown]
[im 13/90  soft-tissue]
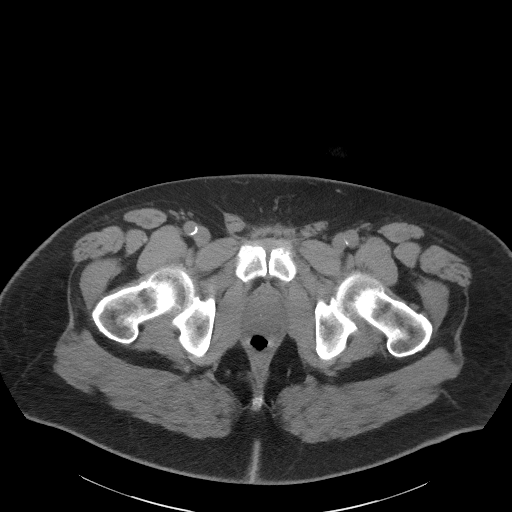
[im 26/90  soft-tissue]
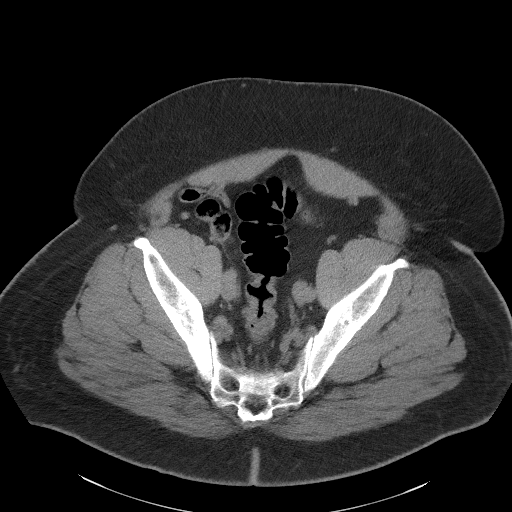

[Series 8: coronal pre · coronal · non-contrast · 0.87mm/px · 2 of 117 slices shown, 3 images]
[im 39/117  soft-tissue]
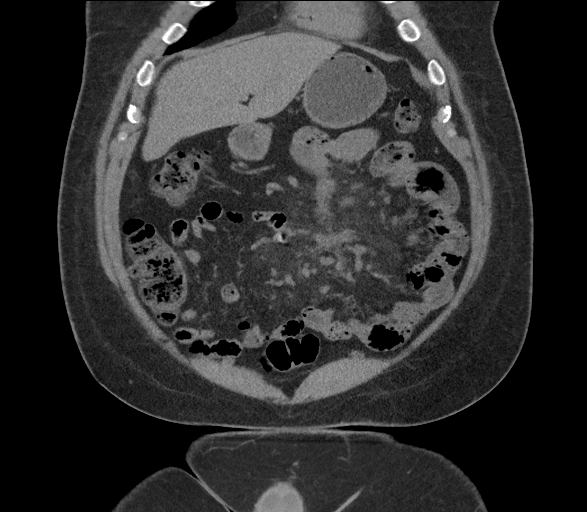
[im 39/117  bone]
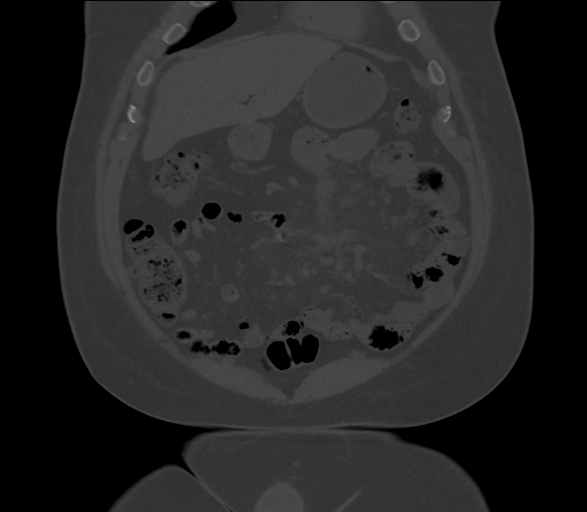
[im 78/117  soft-tissue]
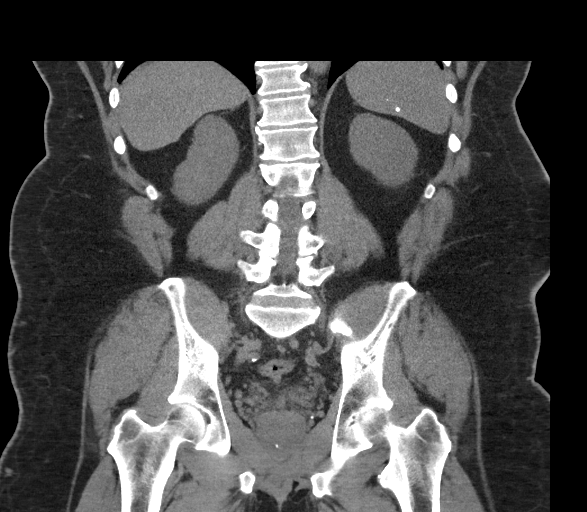

[Series 13: axial delay · axial · delayed · 0.94mm/px · z∈[+986,+1346]mm · 7 of 96 slices shown, 12 images]
[im 12/96  soft-tissue]
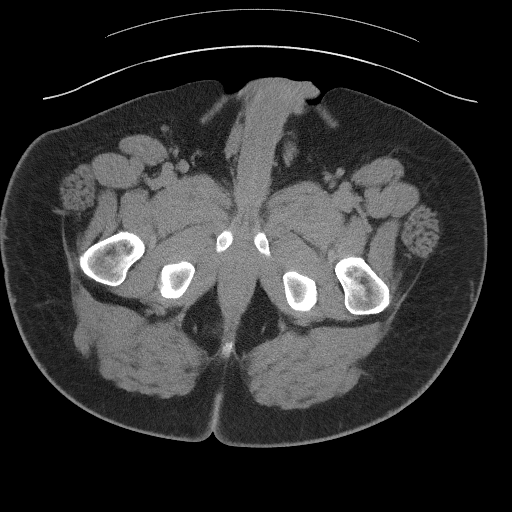
[im 12/96  bone]
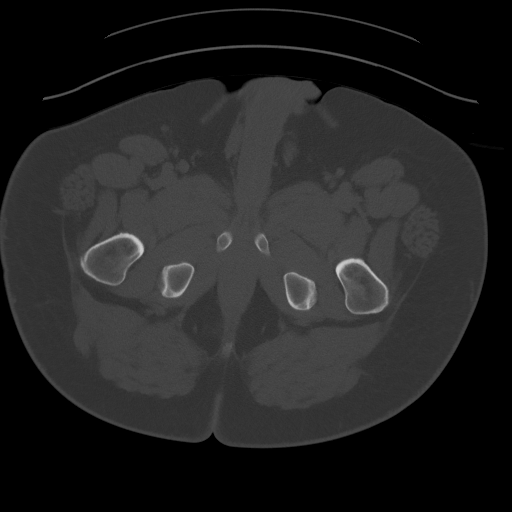
[im 24/96  soft-tissue]
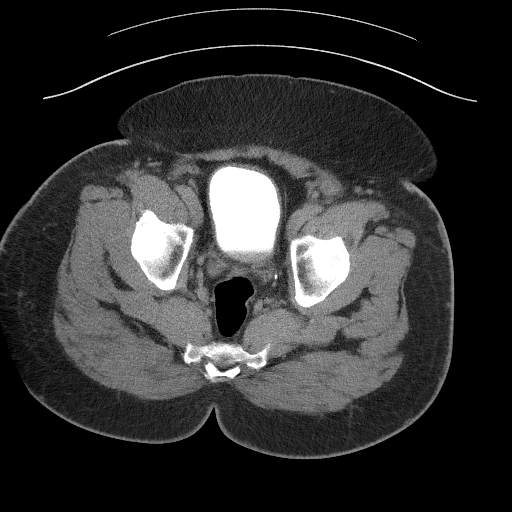
[im 36/96  soft-tissue]
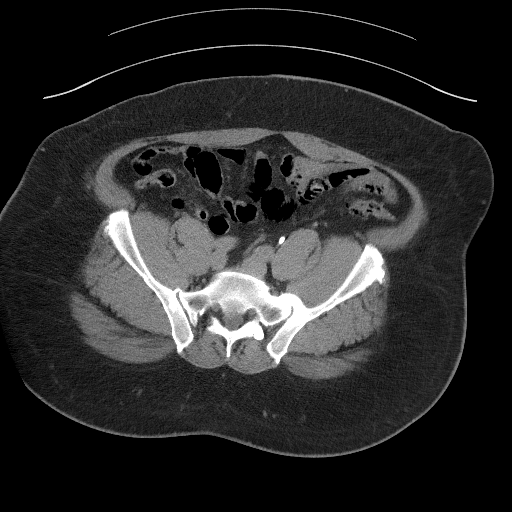
[im 48/96  soft-tissue]
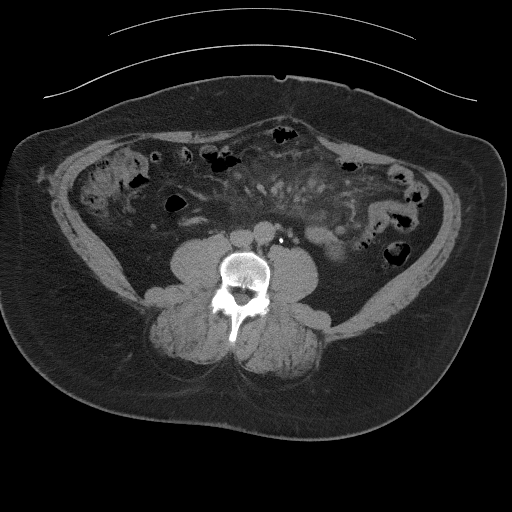
[im 48/96  lung]
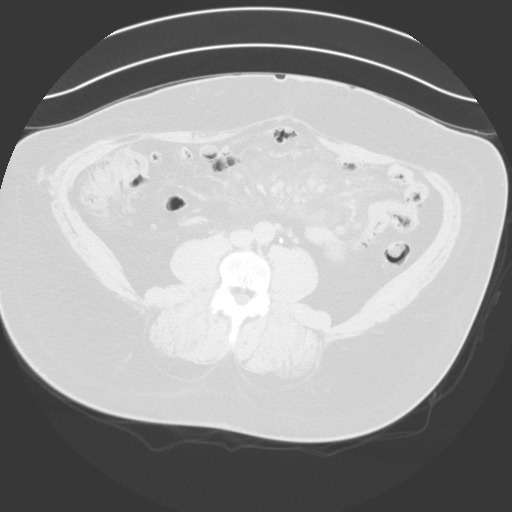
[im 60/96  soft-tissue]
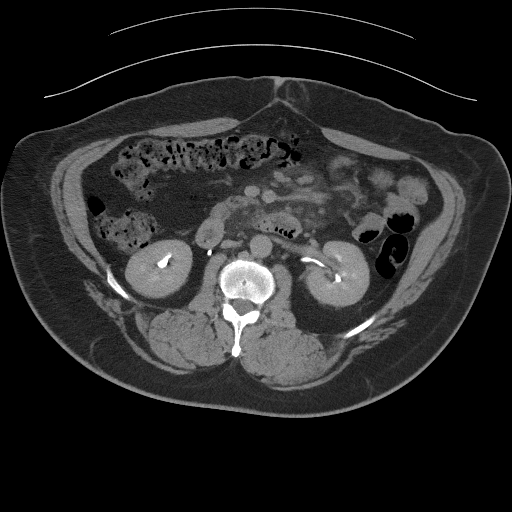
[im 60/96  lung]
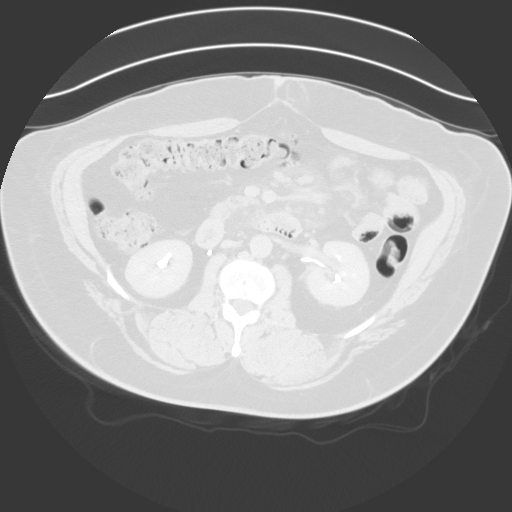
[im 72/96  soft-tissue]
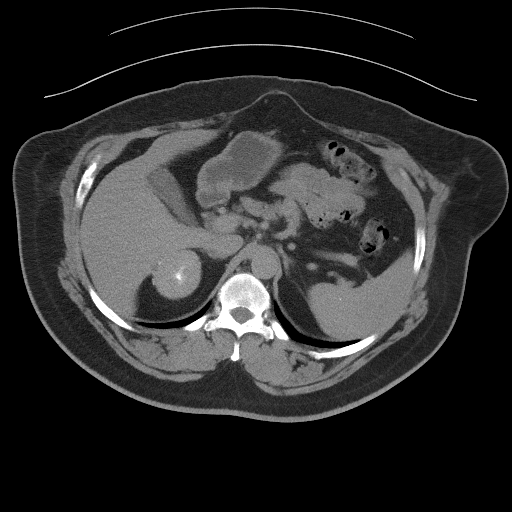
[im 72/96  lung]
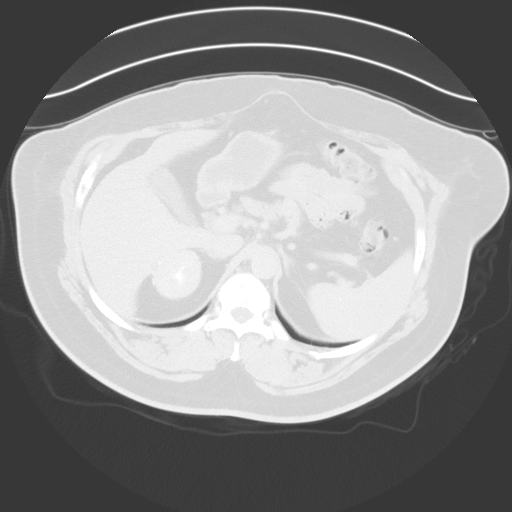
[im 84/96  soft-tissue]
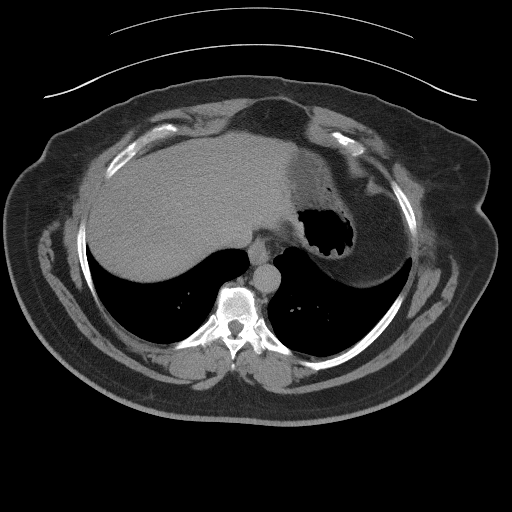
[im 84/96  lung]
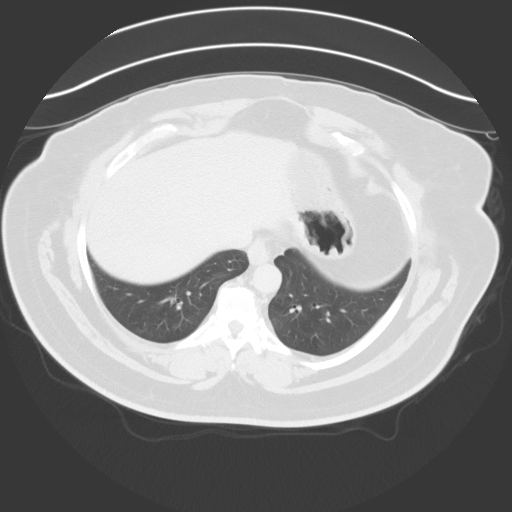

[11 of 46 positions shown; findings below may reference images not displayed]

FINDINGS: Lower chest: Solid 3 mm anterior right lower lobe pulmonary nodule
(series 7/image 7), not definitely seen on 12/12/2007 CT abdomen
study. Calcified granulomatous right hilar nodes, stable.

Hepatobiliary: Stable granulomatous liver calcification. No liver
masses. Nondistended gallbladder contains faintly dense round
structures suggestive of noncalcified gallstones, no gallbladder
wall thickening or pericholecystic fluid. No biliary ductal
dilatation.

Pancreas: Normal, with no mass or duct dilation.

Spleen: Normal size spleen. Scattered granulomatous splenic
calcifications. No splenic mass.

Adrenals/Urinary Tract: Normal adrenals. No hydronephrosis. No renal
stones. No hydronephrosis. Normal caliber ureters, no ureteral
stones. Postsurgical changes from partial nephrectomy in the lateral
upper right kidney. No evidence of enhancing mass at the partial
nephrectomy site. Subcentimeter hypodense renal cortical lesion in
medial upper right kidney, too small to characterize (series 3/image
32), not definitely seen on 12/12/2007 CT. No additional renal
cortical lesions. On delayed imaging, there is no urothelial wall
thickening and there are no filling defects in the opacified
portions of the bilateral collecting systems or ureters, noting
limited evaluation of distal [DATE] of the right ureter due to non
opacification. No bladder stones, wall thickening, masses or
diverticula.

Stomach/Bowel: Small hiatal hernia. Otherwise normal nondistended
stomach. Normal caliber small bowel with no small bowel wall
thickening. Normal appendix. Moderate sigmoid diverticulosis, with
no large bowel wall thickening or significant pericolonic fat
stranding.

Vascular/Lymphatic: Normal caliber abdominal aorta. Patent portal,
splenic, hepatic and renal veins. Mild mesenteric adenopathy
measuring up to 1.3 cm in the left mesentery (series 3/image 44),
new, with associated haziness of the mesenteric fat.

Reproductive: Mildly enlarged prostate.

Other: No pneumoperitoneum, ascites or focal fluid collection. Small
fat containing left supraumbilical ventral abdominal hernia (series
3/image 40).

Musculoskeletal: No aggressive appearing focal osseous lesions. Mild
thoracolumbar spondylosis.
IMPRESSION: 1. No evidence of local tumor recurrence at the partial nephrectomy
site in the lateral upper right kidney. No suspicious renal cortical
masses.
2. No urolithiasis. No hydronephrosis. No evidence of urothelial
lesions, with limitations as described.
3. Mildly enlarged prostate.
4. Nonspecific mild mesenteric adenopathy with associated mesenteric
fat haziness, new since 9449 CT. This is not a typical site for
renal cell carcinoma metastasis. Lymphoma is the primary
consideration. PET-CT could be used for further characterization and
for consideration of potential biopsy planning.
5. Solitary 3 mm right lower lobe solid pulmonary nodule, not
definitely seen on 9449 CT. Recommend attention on follow-up chest
CT in 3 months.
6. Possible cholelithiasis.
7. Small hiatal hernia.
8. Moderate sigmoid diverticulosis.

## 2019-04-23 ENCOUNTER — Other Ambulatory Visit: Payer: Self-pay

## 2019-04-23 ENCOUNTER — Ambulatory Visit (INDEPENDENT_AMBULATORY_CARE_PROVIDER_SITE_OTHER): Payer: PRIVATE HEALTH INSURANCE | Admitting: Nurse Practitioner

## 2019-04-23 ENCOUNTER — Encounter: Payer: Self-pay | Admitting: Nurse Practitioner

## 2019-04-23 DIAGNOSIS — Z1211 Encounter for screening for malignant neoplasm of colon: Secondary | ICD-10-CM

## 2019-04-23 DIAGNOSIS — I10 Essential (primary) hypertension: Secondary | ICD-10-CM | POA: Diagnosis not present

## 2019-04-23 DIAGNOSIS — Z0000 Encounter for general adult medical examination without abnormal findings: Secondary | ICD-10-CM | POA: Diagnosis not present

## 2019-04-23 HISTORY — DX: Encounter for general adult medical examination without abnormal findings: Z00.00

## 2019-04-23 MED ORDER — PEG 3350-KCL-NA BICARB-NACL 420 G PO SOLR: 4000.0000 mL | ORAL | 0 refills | Status: DC

## 2019-04-23 NOTE — Assessment & Plan Note (Signed)
Today the patient was significantly hypertensive with a initial blood pressure of 197/123.  On recheck it was 181/114.  He states he has not taken his morning blood pressure medications, he forgot to take them before coming to our office.  Denies any symptoms as per HPI.  Strongly advised him to go home immediately after his office visit and take his blood pressure medications.  I reinforced the need to take blood pressure medications the day of his procedure.  Follow-up with primary care as needed/recommended for hypertension.

## 2019-04-23 NOTE — Patient Instructions (Addendum)
Your health issues we discussed today were:   Need for colonoscopy: 1. We will schedule your colonoscopy for you 2. Further recommendations will be made after colonoscopy 3. Call us if you have any significant symptoms such as worsening abdominal pain, rectal bleeding, etc. 4. It is very important to make sure you take your blood pressure medications the morning of your procedure  High blood pressure: 1. Take your blood pressure medications as soon as you get home 2. If you develop any chest pain, shortness of breath, headache, dizziness, vision changes, lightheadedness, passing out, other concerning symptoms then proceed to the emergency department  Overall I recommend:  1. Continue your other current medications 2. Call us if you have any questions or concerns 3. Return for follow-up based on post procedure recommendations.   Because of recent events of COVID-19 ("Coronavirus"), follow CDC recommendations:  Wash your hand frequently Avoid touching your face Stay away from people who are sick If you have symptoms such as fever, cough, shortness of breath then call your healthcare provider for further guidance If you are sick, STAY AT HOME unless otherwise directed by your healthcare provider. Follow directions from state and national officials regarding staying safe   At [facility] we value your feedback. You may receive a survey about your visit today. Please share your experience as we strive to create trusting relationships with our patients to provide genuine, compassionate, quality care.  We appreciate your understanding and patience as we review any laboratory studies, imaging, and other diagnostic tests that are ordered as we care for you. Our office policy is 5 business days for review of these results, and any emergent or urgent results are addressed in a timely manner for your best interest. If you do not hear from our office in 1 week, please contact us.    We also encourage the use of MyChart, which contains your medical information for your review as well. If you are not enrolled in this feature, an access code is on this after visit summary for your convenience. Thank you for allowing [place] to be involved in your care.  It was great to see you today!  I hope you have a Happy Thanksgiving!!       Colonoscopy, Adult A colonoscopy is an exam to look at the entire large intestine. During the exam, a lubricated, flexible tube that has a camera on the end of it is inserted into the anus and then passed into the rectum, colon, and other parts of the large intestine. You may have a colonoscopy as a part of normal colorectal screening or if you have certain symptoms, such as:  Lack of red blood cells (anemia).  Diarrhea that does not go away.  Abdominal pain.  Blood in your stool (feces). A colonoscopy can help screen for and diagnose medical problems, including:  Tumors.  Polyps.  Inflammation.  Areas of bleeding. Tell a health care provider about:  Any allergies you have.  All medicines you are taking, including vitamins, herbs, eye drops, creams, and over-the-counter medicines.  Any problems you or family members have had with anesthetic medicines.  Any blood disorders you have.  Any surgeries you have had.  Any medical conditions you have.  Any problems you have had passing stool. What are the risks? Generally, this is a safe procedure. However, problems may occur, including:  Bleeding.  A tear in the intestine.  A reaction to medicines given during the exam.  Infection (rare). What happens  before the procedure? Eating and drinking restrictions Follow instructions from your health care provider about eating and drinking, which may include:  A few days before the procedure - follow a low-fiber diet. Avoid nuts, seeds, dried fruit, raw fruits, and vegetables.  1-3 days before the procedure - follow a clear liquid  diet. Drink only clear liquids, such as clear broth or bouillon, black coffee or tea, clear juice, clear soft drinks or sports drinks, gelatin dessert, and popsicles. Avoid any liquids that contain red or purple dye.  On the day of the procedure - do not eat or drink anything starting 2 hours before the procedure, or within the time period that your health care provider recommends. Up to 2 hours before the procedure, you may continue to drink clear liquids, such as water or clear fruit juice. Bowel prep If you were prescribed an oral bowel prep to clean out your colon:  Take it as told by your health care provider. Starting the day before your procedure, you will need to drink a large amount of medicated liquid. The liquid will cause you to have multiple loose stools until your stool is almost clear or light green.  If your skin or anus gets irritated from diarrhea, you may use these to relieve the irritation: ? Medicated wipes, such as adult wet wipes with aloe and vitamin E. ? A skin-soothing product like petroleum jelly.  If you vomit while drinking the bowel prep, take a break for up to 60 minutes and then begin the bowel prep again. If vomiting continues and you cannot take the bowel prep without vomiting, call your health care provider.  To clean out your colon, you may also be given: ? Laxative medicines. ? Instructions about how to use an enema. General instructions  Ask your health care provider about: ? Changing or stopping your regular medicines or supplements. This is especially important if you are taking iron supplements, diabetes medicines, or blood thinners. ? Taking medicines such as aspirin and ibuprofen. These medicines can thin your blood. Do not take these medicines before the procedure if your health care provider tells you not to.  Plan to have someone take you home from the hospital or clinic. What happens during the procedure?   An IV may be inserted into one of  your veins.  You will be given medicine to help you relax (sedative).  To reduce your risk of infection: ? Your health care team will wash or sanitize their hands. ? Your anal area will be washed with soap.  You will be asked to lie on your side with your knees bent.  Your health care provider will lubricate a long, thin, flexible tube. The tube will have a camera and a light on the end.  The tube will be inserted into your anus.  The tube will be gently eased through your rectum and colon.  Air will be delivered into your colon to keep it open. You may feel some pressure or cramping.  The camera will be used to take images during the procedure.  A small tissue sample may be removed to be examined under a microscope (biopsy).  If small polyps are found, your health care provider may remove them and have them checked for cancer cells.  When the exam is done, the tube will be removed. The procedure may vary among health care providers and hospitals. What happens after the procedure?  Your blood pressure, heart rate, breathing rate, and blood oxygen level  will be monitored until the medicines you were given have worn off.  Do not drive for 24 hours after the exam.  You may have a small amount of blood in your stool.  You may pass gas and have mild abdominal cramping or bloating due to the air that was used to inflate your colon during the exam.  It is up to you to get the results of your procedure. Ask your health care provider, or the department performing the procedure, when your results will be ready. Summary  A colonoscopy is an exam to look at the entire large intestine.  During a colonoscopy, a lubricated, flexible tube with a camera on the end of it is inserted into the anus and then passed into the colon and other parts of the large intestine.  Follow instructions from your health care provider about eating and drinking before the procedure.  If you were prescribed  an oral bowel prep to clean out your colon, take it as told by your health care provider.  After your procedure, your blood pressure, heart rate, breathing rate, and blood oxygen level will be monitored until the medicines you were given have worn off. This information is not intended to replace advice given to you by your health care provider. Make sure you discuss any questions you have with your health care provider. Document Released: 05/14/2000 Document Revised: 03/09/2017 Document Reviewed: 07/29/2015 Elsevier Patient Education  2020 [employer].

## 2019-04-23 NOTE — Assessment & Plan Note (Signed)
The patient is currently due for first-ever screening colonoscopy.  Denies overt GI symptoms.  His father did have polyps but no family history of colon cancer.  Office visit due to morbid obesity necessitating monitored anesthesia care.  We will proceed with scheduling colonoscopy at this time.  Proceed with TCS on propofol/MAC with Dr. Gala Romney in near future: the risks, benefits, and alternatives have been discussed with the patient in detail. The patient states understanding and desires to proceed.  The patient is not on any anticoagulants, anxiolytics, chronic pain medications, antidepressants, antidiabetics, or iron supplements.  His BMI is currently 51.  We will plan for the procedure on propofol/MAC to promote safety and adequate sedation.

## 2019-04-23 NOTE — Progress Notes (Signed)
Primary Care Physician:  Kathyrn Drown, MD Primary Gastroenterologist:  Dr. Gala Romney  Chief Complaint  Patient presents with  . Consult    TCS never done prior. Thinks father has hx polyps    HPI:   [name] is a 53 y.o. male who presents on referral to schedule colonoscopy.  Nurse/phone triage was deferred to office visit due to weight which may necessitate augmented sedation.  Reviewed information provided with referral including office visit dated 03/07/2019 which is her routine primary care office visit.  At that time it was noted the patient's BMI was greater than 50.  Recommended referral to GI for colonoscopy.  No history of colonoscopy in our system.  Today he states he's doing ok overall. He's never had a colonoscopy. His father had colon polyps. Denies abdominal pain other than rare abdominal hernia discomfort. Denies N/V, hematochezia, melena, fever, chills, unintentional weight loss. Denies URI or flu-like symptoms. Denies loss of sense of taste or smell. Denies chest pain, dyspnea, dizziness, lightheadedness, syncope, near syncope. Denies any other upper or lower GI symptoms.  His blood pressure is quite elevated today., He denies any related symptoms (chest pain, dyspnea, headache, dizziness, etc). He has 2 morning-time medications but forgot to take them prior to his office visit.  Past Medical History:  Diagnosis Date  . Cancer ([place])    kidney  . Hypertension   . Renal disorder     Past Surgical History:  Procedure Laterality Date  . KIDNEY SURGERY Right    Partial nephrectomy d/t kidney cancer    Current Outpatient Medications  Medication Sig Dispense Refill  . amLODipine (NORVASC) 5 MG tablet TAKE 1 TABLET (5 MG TOTAL) BY MOUTH DAILY. 90 tablet 1  . triamterene-hydrochlorothiazide (MAXZIDE) 75-50 MG tablet Take 1 tablet by mouth daily. 90 tablet 1   No current facility-administered medications for this visit.     Allergies as of 04/23/2019  . (No  Known Allergies)    Family History  Problem Relation Age of Onset  . Colon polyps Father   . Colon cancer Neg Hx     Social History   Socioeconomic History  . Marital status: Divorced    Spouse name: Not on file  . Number of children: Not on file  . Years of education: Not on file  . Highest education level: Not on file  Occupational History  . Not on file  Social Needs  . Financial resource strain: Not on file  . Food insecurity    Worry: Not on file    Inability: Not on file  . Transportation needs    Medical: Not on file    Non-medical: Not on file  Tobacco Use  . Smoking status: Former Smoker    Types: Cigarettes  . Smokeless tobacco: Never Used  Substance and Sexual Activity  . Alcohol use: Yes    Comment: occas wine  . Drug use: No  . Sexual activity: Not on file  Lifestyle  . Physical activity    Days per week: Not on file    Minutes per session: Not on file  . Stress: Not on file  Relationships  . Social Herbalist on phone: Not on file    Gets together: Not on file    Attends religious service: Not on file    Active member of club or organization: Not on file    Attends meetings of clubs or organizations: Not on file  Relationship status: Not on file  . Intimate partner violence    Fear of current or ex partner: Not on file    Emotionally abused: Not on file    Physically abused: Not on file    Forced sexual activity: Not on file  Other Topics Concern  . Not on file  Social History Narrative  . Not on file    Review of Systems: General: Negative for anorexia, weight loss, fever, chills, fatigue, weakness. ENT: Negative for hoarseness, difficulty swallowing. CV: Negative for chest pain, angina, palpitations, peripheral edema.  Respiratory: Negative for dyspnea at rest, cough, sputum, wheezing.  GI: See history of present illness. MS: Negative for joint pain, low back pain.  Derm: Negative for rash or itching.  Endo: Negative for  unusual weight change.  Heme: Negative for bruising or bleeding. Allergy: Negative for rash or hives.    Physical Exam: BP (!) 180/114   Pulse 100   Temp 97.7 F (36.5 C) (Oral)   Ht 5\' 7"  (1.702 m)   Wt (!) 328 lb 6.4 oz (149 kg)   BMI 51.43 kg/m  General:   Morbidly obese male. Alert and oriented. Pleasant and cooperative. Well-nourished and well-developed.  Head:  Normocephalic and atraumatic. Eyes:  Without icterus, sclera clear and conjunctiva pink.  Ears:  Normal auditory acuity. Cardiovascular:  S1, S2 present without murmurs appreciated. Normal pulses noted. Extremities without clubbing or edema. Respiratory:  Clear to auscultation bilaterally. No wheezes, rales, or rhonchi. No distress.  Gastrointestinal:  +BS, obese but soft, non-tender and non-distended. No HSM noted. No guarding or rebound. No masses appreciated.  Rectal:  Deferred  Musculoskalatal:  Symmetrical without gross deformities. Normal posture. Neurologic:  Alert and oriented x4;  grossly normal neurologically. Psych:  Alert and cooperative. Normal mood and affect. Heme/Lymph/Immune: No excessive bruising noted.    04/23/2019 10:14 AM   Disclaimer: This note was dictated with voice recognition software. Similar sounding words can inadvertently be transcribed and may not be corrected upon review.

## 2019-04-23 NOTE — Progress Notes (Signed)
cc'ed to pcp °

## 2019-05-08 ENCOUNTER — Other Ambulatory Visit: Payer: Self-pay

## 2019-05-08 NOTE — Patient Instructions (Signed)
Called [employer], no PA needed for TCS. Ref# [id number].

## 2019-05-17 ENCOUNTER — Encounter: Payer: Self-pay | Admitting: Family Medicine

## 2019-05-17 NOTE — Telephone Encounter (Signed)
Nurses Please set patient up for virtual visit tomorrow with Dr. Richardson Landry

## 2019-05-17 NOTE — Telephone Encounter (Signed)
Pt contacted and informed that we will be calling him in the morning to set up appt. Pt verbalized understanding

## 2019-05-18 ENCOUNTER — Other Ambulatory Visit: Payer: Self-pay

## 2019-05-18 ENCOUNTER — Ambulatory Visit (INDEPENDENT_AMBULATORY_CARE_PROVIDER_SITE_OTHER): Payer: PRIVATE HEALTH INSURANCE | Admitting: Family Medicine

## 2019-05-18 DIAGNOSIS — N3000 Acute cystitis without hematuria: Secondary | ICD-10-CM

## 2019-05-18 DIAGNOSIS — B9689 Other specified bacterial agents as the cause of diseases classified elsewhere: Secondary | ICD-10-CM | POA: Diagnosis not present

## 2019-05-18 MED ORDER — NITROFURANTOIN MONOHYD MACRO 100 MG PO CAPS: 100.0000 mg | ORAL_CAPSULE | Freq: Two times a day (BID) | ORAL | 0 refills | Status: DC

## 2019-05-18 NOTE — Progress Notes (Signed)
   Subjective:    Patient ID: [name], male    DOB: [birth year], 53 y.o.   MRN: [id number]  Dysuria  This is a new problem. The current episode started yesterday. Associated symptoms include urgency. He has tried nothing for the symptoms.   Virtual Visit via Video Note  I connected with PRESTEN BEH on 05/18/19 at 10:00 AM EST by a video enabled telemedicine application and verified that I am speaking with the correct person using two identifiers.  Location: Patient: home Provider: office   I discussed the limitations of evaluation and management by telemedicine and the availability of in person appointments. The patient expressed understanding and agreed to proceed.  History of Present Illness:    Observations/Objective:   Assessment and Plan:   Follow Up Instructions:    I discussed the assessment and treatment plan with the patient. The patient was provided an opportunity to ask questions and all were answered. The patient agreed with the plan and demonstrated an understanding of the instructions.   The patient was advised to call back or seek an in-person evaluation if the symptoms worsen or if the condition fails to improve as anticipated.  I provided 18 minutes of non-face-to-face time during this encounter.  Increased urinary frequency.  Some element of dysuria.  No fever no chills no vomiting.     Review of Systems  Genitourinary: Positive for dysuria and urgency.       Objective:   Physical Exam    Virtual    Assessment & Plan:  Impression urinary tract infection.  No signs or symptoms suggestive of upper urinary tract infection.  The patient is at risk for this.  Will cover with antibiotics.  Symptom care discussed warning signs discussed possible prostate involvement at this time will cover with solid dose for 10 days

## 2019-07-04 NOTE — Patient Instructions (Signed)
EDGEL VI  07/04/2019     @PREFPERIOPPHARMACY @   Your procedure is scheduled on  07/09/2019 .  Report to [facility] at  1245  P.M.  Call this number if you have problems the morning of surgery:  [phone]   Remember:  Follow the diet and prep instructions given to you by Dr Roseanne Kaufman office.                     Take these medicines the morning of surgery with A SIP OF WATER  Amlodipine.    Do not wear jewelry, make-up or nail polish.  Do not wear lotions, powders, or perfumes. Please wear deodorant and brush your teeth.  Do not shave 48 hours prior to surgery.  Men may shave face and neck.  Do not bring valuables to the hospital.  [facility] is not responsible for any belongings or valuables.  Contacts, dentures or bridgework may not be worn into surgery.  Leave your suitcase in the car.  After surgery it may be brought to your room.  For patients admitted to the hospital, discharge time will be determined by your treatment team.  Patients discharged the day of surgery will not be allowed to drive home.   Name and phone number of your driver:   family Special instructions:  None  Please read over the following fact sheets that you were given. Anesthesia Post-op Instructions and Care and Recovery After Surgery       Colonoscopy, Adult, Care After This sheet gives you information about how to care for yourself after your procedure. Your health care provider may also give you more specific instructions. If you have problems or questions, contact your health care provider. What can I expect after the procedure? After the procedure, it is common to have:  A small amount of blood in your stool for 24 hours after the procedure.  Some gas.  Mild cramping or bloating of your abdomen. Follow these instructions at home: Eating and drinking   Drink enough fluid to keep your urine pale yellow.  Follow instructions from your health care provider about eating or  drinking restrictions.  Resume your normal diet as instructed by your health care provider. Avoid heavy or fried foods that are hard to digest. Activity  Rest as told by your health care provider.  Avoid sitting for a long time without moving. Get up to take short walks every 1-2 hours. This is important to improve blood flow and breathing. Ask for help if you feel weak or unsteady.  Return to your normal activities as told by your health care provider. Ask your health care provider what activities are safe for you. Managing cramping and bloating   Try walking around when you have cramps or feel bloated.  Apply heat to your abdomen as told by your health care provider. Use the heat source that your health care provider recommends, such as a moist heat pack or a heating pad. ? Place a towel between your skin and the heat source. ? Leave the heat on for 20-30 minutes. ? Remove the heat if your skin turns bright red. This is especially important if you are unable to feel pain, heat, or cold. You may have a greater risk of getting burned. General instructions  For the first 24 hours after the procedure: ? Do not drive or use machinery. ? Do not sign important documents. ? Do not drink alcohol. ?  Do your regular daily activities at a slower pace than normal. ? Eat soft foods that are easy to digest.  Take over-the-counter and prescription medicines only as told by your health care provider.  Keep all follow-up visits as told by your health care provider. This is important. Contact a health care provider if:  You have blood in your stool 2-3 days after the procedure. Get help right away if you have:  More than a small spotting of blood in your stool.  Large blood clots in your stool.  Swelling of your abdomen.  Nausea or vomiting.  A fever.  Increasing pain in your abdomen that is not relieved with medicine. Summary  After the procedure, it is common to have a small amount  of blood in your stool. You may also have mild cramping and bloating of your abdomen.  For the first 24 hours after the procedure, do not drive or use machinery, sign important documents, or drink alcohol.  Get help right away if you have a lot of blood in your stool, nausea or vomiting, a fever, or increased pain in your abdomen. This information is not intended to replace advice given to you by your health care provider. Make sure you discuss any questions you have with your health care provider. Document Revised: 12/11/2018 Document Reviewed: 12/11/2018 Elsevier Patient Education  [place] After These instructions provide you with information about caring for yourself after your procedure. Your health care provider may also give you more specific instructions. Your treatment has been planned according to current medical practices, but problems sometimes occur. Call your health care provider if you have any problems or questions after your procedure. What can I expect after the procedure? After your procedure, you may:  Feel sleepy for several hours.  Feel clumsy and have poor balance for several hours.  Feel forgetful about what happened after the procedure.  Have poor judgment for several hours.  Feel nauseous or vomit.  Have a sore throat if you had a breathing tube during the procedure. Follow these instructions at home: For at least 24 hours after the procedure:      Have a responsible adult stay with you. It is important to have someone help care for you until you are awake and alert.  Rest as needed.  Do not: ? Participate in activities in which you could fall or become injured. ? Drive. ? Use heavy machinery. ? Drink alcohol. ? Take sleeping pills or medicines that cause drowsiness. ? Make important decisions or sign legal documents. ? Take care of children on your own. Eating and drinking  Follow the diet that is  recommended by your health care provider.  If you vomit, drink water, juice, or soup when you can drink without vomiting.  Make sure you have little or no nausea before eating solid foods. General instructions  Take over-the-counter and prescription medicines only as told by your health care provider.  If you have sleep apnea, surgery and certain medicines can increase your risk for breathing problems. Follow instructions from your health care provider about wearing your sleep device: ? Anytime you are sleeping, including during daytime naps. ? While taking prescription pain medicines, sleeping medicines, or medicines that make you drowsy.  If you smoke, do not smoke without supervision.  Keep all follow-up visits as told by your health care provider. This is important. Contact a health care provider if:  You keep feeling nauseous or you keep  vomiting.  You feel light-headed.  You develop a rash.  You have a fever. Get help right away if:  You have trouble breathing. Summary  For several hours after your procedure, you may feel sleepy and have poor judgment.  Have a responsible adult stay with you for at least 24 hours or until you are awake and alert. This information is not intended to replace advice given to you by your health care provider. Make sure you discuss any questions you have with your health care provider. Document Revised: 08/15/2017 Document Reviewed: 09/07/2015 Elsevier Patient Education  [place].

## 2019-07-06 ENCOUNTER — Other Ambulatory Visit (HOSPITAL_COMMUNITY)
Admission: RE | Admit: 2019-07-06 | Discharge: 2019-07-06 | Disposition: A | Discharge status: Home / Self Care | Payer: HRSA Program | Source: Ambulatory Visit | Attending: Internal Medicine | Admitting: Internal Medicine

## 2019-07-06 ENCOUNTER — Other Ambulatory Visit: Payer: Self-pay

## 2019-07-06 ENCOUNTER — Encounter (HOSPITAL_COMMUNITY): Payer: Self-pay

## 2019-07-06 ENCOUNTER — Encounter (HOSPITAL_COMMUNITY)
Admission: RE | Admit: 2019-07-06 | Discharge: 2019-07-06 | Disposition: A | Discharge status: Home / Self Care | Payer: HRSA Program | Source: Ambulatory Visit | Attending: Internal Medicine | Admitting: Internal Medicine

## 2019-07-06 DIAGNOSIS — U071 COVID-19: Secondary | ICD-10-CM | POA: Diagnosis not present

## 2019-07-06 DIAGNOSIS — Z01812 Encounter for preprocedural laboratory examination: Secondary | ICD-10-CM | POA: Diagnosis present

## 2019-07-06 HISTORY — DX: Sleep apnea, unspecified: G47.30

## 2019-07-06 LAB — BASIC METABOLIC PANEL
Anion gap: 9 (ref 5–15)
BUN: 14 mg/dL (ref 6–20)
CO2: 29 mmol/L (ref 22–32)
Calcium: 9.4 mg/dL (ref 8.9–10.3)
Chloride: 100 mmol/L (ref 98–111)
Creatinine, Ser: 1.04 mg/dL (ref 0.61–1.24)
GFR calc Af Amer: >60 mL/min (ref >60)
GFR calc non Af Amer: >60 mL/min (ref >60)
Glucose, Bld: 104 mg/dL — ABNORMAL HIGH (ref 70–99)
Potassium: 3.6 mmol/L (ref 3.5–5.1)
Sodium: 138 mmol/L (ref 135–145)

## 2019-07-06 LAB — SARS CORONAVIRUS 2 (TAT 6-24 HRS): SARS Coronavirus 2: POSITIVE — AB

## 2019-07-07 ENCOUNTER — Telehealth: Payer: Self-pay | Admitting: Nurse Practitioner

## 2019-07-07 NOTE — Telephone Encounter (Signed)
Called to discuss with [name] about Covid symptoms and the use of bamlanivimab, a monoclonal antibody infusion for those with mild to moderate Covid symptoms and at a high risk of hospitalization.    Pt does not qualify for infusion therapy as he has asymptomatic infection. Isolation precautions discussed. Advised to contact back for consideration should he develop symptoms. Patient verbalized understanding.   Patient Active Problem List   Diagnosis Date Noted  . Preventative health care 04/23/2019  . Ventral hernia without obstruction or gangrene 02/04/2017  . Hyperlipidemia 01/13/2015  . HTN (hypertension), benign 11/09/2013  . Morbid obesity ([place]) 11/09/2013    Alda Lea, AGPCNP-BC Pager: [phone] Amion: N. [place]

## 2019-07-08 ENCOUNTER — Telehealth: Payer: Self-pay | Admitting: Internal Medicine

## 2019-07-09 ENCOUNTER — Ambulatory Visit (HOSPITAL_COMMUNITY)
Admission: RE | Admit: 2019-07-09 | Payer: PRIVATE HEALTH INSURANCE | Source: Home / Self Care | Admitting: Internal Medicine

## 2019-07-09 ENCOUNTER — Telehealth: Payer: Self-pay

## 2019-07-09 ENCOUNTER — Other Ambulatory Visit: Payer: Self-pay

## 2019-07-09 ENCOUNTER — Encounter (HOSPITAL_COMMUNITY): Admission: RE | Payer: Self-pay | Source: Home / Self Care

## 2019-07-09 SURGERY — COLONOSCOPY WITH PROPOFOL
Anesthesia: Monitor Anesthesia Care

## 2019-07-09 MED ORDER — PEG 3350-KCL-NA BICARB-NACL 420 G PO SOLR: 4000.0000 mL | ORAL | 0 refills | Status: DC

## 2019-07-09 NOTE — Telephone Encounter (Signed)
Per result note: Rourk, Cristopher Estimable, MD  Gerilyn Nestle, RN  Cc: Hassan Rowan, LPN  COVID positive; Mondays procedure cancelled   Called pt, TCS w/Prop w/RMR rescheduled to 09/03/19 at 1:15pm. Pt hadn't picked up prep. Resent rx for prep to pharmacy. Orders entered. Informed endo scheduler pt was positive for COVID 07/06/19, will not need COVID test prior to procedure.

## 2019-07-09 NOTE — Telephone Encounter (Signed)
Pre-op 08/30/19. Letter mailed with procedure instructions.

## 2019-07-10 NOTE — Telephone Encounter (Signed)
done

## 2019-08-03 ENCOUNTER — Ambulatory Visit: Payer: PRIVATE HEALTH INSURANCE | Attending: Internal Medicine

## 2019-08-03 DIAGNOSIS — Z23 Encounter for immunization: Secondary | ICD-10-CM | POA: Insufficient documentation

## 2019-08-03 NOTE — Progress Notes (Signed)
   Covid-19 Vaccination Clinic  Name:  [name]    MRN: [id number] DOB: May 19, 1966  08/03/2019  [name] was observed post Covid-19 immunization for 15 minutes without incident. He was provided with Vaccine Information Sheet and instruction to access the V-Safe system.   [name] was instructed to call 911 with any severe reactions post vaccine: Marland Kitchen Difficulty breathing  . Swelling of face and throat  . A fast heartbeat  . A bad rash all over body  . Dizziness and weakness   Immunizations Administered    Name Date Dose VIS Date Route   Moderna COVID-19 Vaccine 08/03/2019 10:23 AM 0.5 mL 05/01/2019 Intramuscular   Manufacturer: Moderna   Lot: [id number]   [id number]

## 2019-08-30 ENCOUNTER — Encounter (HOSPITAL_COMMUNITY): Payer: Self-pay

## 2019-08-30 ENCOUNTER — Encounter (HOSPITAL_COMMUNITY)
Admission: RE | Admit: 2019-08-30 | Discharge: 2019-08-30 | Disposition: A | Discharge status: Home / Self Care | Payer: Self-pay | Source: Ambulatory Visit | Attending: Internal Medicine | Admitting: Internal Medicine

## 2019-08-30 ENCOUNTER — Encounter: Payer: Self-pay | Admitting: Family Medicine

## 2019-08-30 ENCOUNTER — Other Ambulatory Visit: Payer: Self-pay

## 2019-09-01 ENCOUNTER — Encounter: Payer: Self-pay | Admitting: Nurse Practitioner

## 2019-09-01 ENCOUNTER — Encounter: Payer: Self-pay | Admitting: Family Medicine

## 2019-09-03 ENCOUNTER — Telehealth: Payer: Self-pay

## 2019-09-03 NOTE — Telephone Encounter (Signed)
Pt sent a MyChart message and called endo 09/01/19, he was unable to get TCS prep. TCS was scheduled for today.  Pt called office, he went to [facility] 09/01/19, [facility] wasn't available. Also unavailable at [facility]. TCS w/Prop w/RMR rescheduled to 12/06/19 at 10:00am. He doesn't have insurance at this time. Advised him if he gets insurance to let our office know. Verbalized understanding.  LMOVM for endo scheduler. Pre-op and COVID test 12/04/19. Letter mailed with new procedure instructions.

## 2019-09-04 ENCOUNTER — Ambulatory Visit: Payer: Self-pay | Attending: Internal Medicine

## 2019-09-04 DIAGNOSIS — Z23 Encounter for immunization: Secondary | ICD-10-CM

## 2019-09-04 NOTE — Progress Notes (Signed)
   Covid-19 Vaccination Clinic  Name:  [name]    MRN: [id number] DOB: 04/15/1966  09/04/2019  [name] was observed post Covid-19 immunization for 15 minutes without incident. He was provided with Vaccine Information Sheet and instruction to access the V-Safe system.   [name] was instructed to call 911 with any severe reactions post vaccine: Marland Kitchen Difficulty breathing  . Swelling of face and throat  . A fast heartbeat  . A bad rash all over body  . Dizziness and weakness   Immunizations Administered    Name Date Dose VIS Date Route   Moderna COVID-19 Vaccine 09/04/2019 11:16 AM 0.5 mL 05/01/2019 Intramuscular   Manufacturer: Moderna   LotHQ:[id number]   [place]:[id number]

## 2019-10-16 NOTE — Telephone Encounter (Signed)
Please sign encounter 

## 2019-11-20 ENCOUNTER — Telehealth: Payer: Self-pay | Admitting: Family Medicine

## 2019-11-20 NOTE — Telephone Encounter (Signed)
Last labs 12/2018: Hiv, glucose,lipid

## 2019-11-20 NOTE — Telephone Encounter (Signed)
Pt has cpe in august and would like lab work done.

## 2019-11-23 ENCOUNTER — Other Ambulatory Visit: Payer: Self-pay | Admitting: *Deleted

## 2019-11-23 DIAGNOSIS — Z0000 Encounter for general adult medical examination without abnormal findings: Secondary | ICD-10-CM

## 2019-11-23 DIAGNOSIS — E785 Hyperlipidemia, unspecified: Secondary | ICD-10-CM

## 2019-11-23 DIAGNOSIS — I10 Essential (primary) hypertension: Secondary | ICD-10-CM

## 2019-11-23 DIAGNOSIS — Z125 Encounter for screening for malignant neoplasm of prostate: Secondary | ICD-10-CM

## 2019-11-23 NOTE — Telephone Encounter (Signed)
Lipid, liver, metabolic 7, PSA  Wellness exam, screening for prostate cancer, hyperlipidemia, morbid obesity

## 2019-11-23 NOTE — Telephone Encounter (Signed)
Patient notified. Labs ordered.

## 2019-11-29 ENCOUNTER — Telehealth: Payer: Self-pay

## 2019-11-29 MED ORDER — LACTATED RINGERS IV SOLN: Freq: Once | INTRAVENOUS | Status: DC

## 2019-11-29 NOTE — Telephone Encounter (Signed)
Pt called office and LMOVM. He is scheduled for TCS 12/06/19. He wanted to check about getting prep.  Called pt, he was given Miralax prep instructions. Informed him items needed are OTC. Sent pre-op/covid test appt and procedure instructions to pt via MyChart.

## 2019-11-29 NOTE — Patient Instructions (Addendum)
   Your procedure is scheduled on: 12/06/2019  Report to [facility] at   8:30  AM.  Call this number if you have problems the morning of surgery: [phone]   Remember:              Follow Directions on the letter you received from Your Physician's office regarding the Bowel Prep              No Smoking the day of Procedure :   Take these medicines the morning of surgery with A SIP OF WATER: Amlodipine   Do not wear jewelry, make-up or nail polish.    Do not bring valuables to the hospital.  Contacts, dentures or bridgework may not be worn into surgery.  .   Patients discharged the day of surgery will not be allowed to drive home.     Colonoscopy, Adult, Care After This sheet gives you information about how to care for yourself after your procedure. Your health care provider may also give you more specific instructions. If you have problems or questions, contact your health care provider. What can I expect after the procedure? After the procedure, it is common to have:  A small amount of blood in your stool for 24 hours after the procedure.  Some gas.  Mild abdominal cramping or bloating.  Follow these instructions at home: General instructions   For the first 24 hours after the procedure: ? Do not drive or use machinery. ? Do not sign important documents. ? Do not drink alcohol. ? Do your regular daily activities at a slower pace than normal. ? Eat soft, easy-to-digest foods. ? Rest often.  Take over-the-counter or prescription medicines only as told by your health care provider.  It is up to you to get the results of your procedure. Ask your health care provider, or the department performing the procedure, when your results will be ready. Relieving cramping and bloating  Try walking around when you have cramps or feel bloated.  Apply heat to your abdomen as told by your health care provider. Use a heat source that your health care provider recommends, such as a  moist heat pack or a heating pad. ? Place a towel between your skin and the heat source. ? Leave the heat on for 20-30 minutes. ? Remove the heat if your skin turns bright red. This is especially important if you are unable to feel pain, heat, or cold. You may have a greater risk of getting burned. Eating and drinking  Drink enough fluid to keep your urine clear or pale yellow.  Resume your normal diet as instructed by your health care provider. Avoid heavy or fried foods that are hard to digest.  Avoid drinking alcohol for as long as instructed by your health care provider. Contact a health care provider if:  You have blood in your stool 2-3 days after the procedure. Get help right away if:  You have more than a small spotting of blood in your stool.  You pass large blood clots in your stool.  Your abdomen is swollen.  You have nausea or vomiting.  You have a fever.  You have increasing abdominal pain that is not relieved with medicine. This information is not intended to replace advice given to you by your health care provider. Make sure you discuss any questions you have with your health care provider. Document Released: 12/30/2003 Document Revised: 02/09/2016 Document Reviewed: 07/29/2015 Elsevier Interactive Patient Education  [employer].

## 2019-12-04 ENCOUNTER — Encounter (HOSPITAL_COMMUNITY): Payer: Self-pay

## 2019-12-04 ENCOUNTER — Other Ambulatory Visit: Payer: Self-pay

## 2019-12-04 ENCOUNTER — Other Ambulatory Visit (HOSPITAL_COMMUNITY)
Admission: RE | Admit: 2019-12-04 | Discharge: 2019-12-04 | Disposition: A | Discharge status: Home / Self Care | Payer: Self-pay | Source: Ambulatory Visit | Attending: Internal Medicine | Admitting: Internal Medicine

## 2019-12-04 ENCOUNTER — Encounter (HOSPITAL_COMMUNITY)
Admission: RE | Admit: 2019-12-04 | Discharge: 2019-12-04 | Disposition: A | Discharge status: Home / Self Care | Payer: Self-pay | Source: Ambulatory Visit | Attending: Internal Medicine | Admitting: Internal Medicine

## 2019-12-04 DIAGNOSIS — Z20822 Contact with and (suspected) exposure to covid-19: Secondary | ICD-10-CM | POA: Insufficient documentation

## 2019-12-04 DIAGNOSIS — I10 Essential (primary) hypertension: Secondary | ICD-10-CM | POA: Insufficient documentation

## 2019-12-04 DIAGNOSIS — Z01818 Encounter for other preprocedural examination: Secondary | ICD-10-CM | POA: Insufficient documentation

## 2019-12-04 LAB — BASIC METABOLIC PANEL
Anion gap: 8 (ref 5–15)
BUN: 13 mg/dL (ref 6–20)
CO2: 28 mmol/L (ref 22–32)
Calcium: 9.2 mg/dL (ref 8.9–10.3)
Chloride: 102 mmol/L (ref 98–111)
Creatinine, Ser: 1.07 mg/dL (ref 0.61–1.24)
GFR calc Af Amer: >60 mL/min (ref >60)
GFR calc non Af Amer: >60 mL/min (ref >60)
Glucose, Bld: 109 mg/dL — ABNORMAL HIGH (ref 70–99)
Potassium: 4.1 mmol/L (ref 3.5–5.1)
Sodium: 138 mmol/L (ref 135–145)

## 2019-12-04 LAB — SARS CORONAVIRUS 2 (TAT 6-24 HRS): SARS Coronavirus 2: NEGATIVE

## 2019-12-05 ENCOUNTER — Telehealth: Payer: Self-pay | Admitting: Family Medicine

## 2019-12-05 NOTE — Telephone Encounter (Signed)
Patient was being worked up by gastroenterology for his testing EKG showed some abnormal changes that need further looking into Please connect with patient I do recommend a cardiology consultation because of his abnormal EKG If the patient would like to follow-up here next week first to discuss test this can be done Otherwise referral to cardiology for further evaluation due to abnormal EKG

## 2019-12-06 ENCOUNTER — Encounter (HOSPITAL_COMMUNITY)
Admission: RE | Disposition: A | Discharge status: Home / Self Care | Payer: Self-pay | Source: Home / Self Care | Attending: Internal Medicine

## 2019-12-06 ENCOUNTER — Ambulatory Visit (HOSPITAL_COMMUNITY)
Admission: RE | Admit: 2019-12-06 | Discharge: 2019-12-06 | Disposition: A | Discharge status: Home / Self Care | Payer: Self-pay | Attending: Internal Medicine | Admitting: Internal Medicine

## 2019-12-06 ENCOUNTER — Encounter: Payer: Self-pay | Admitting: Nurse Practitioner

## 2019-12-06 ENCOUNTER — Encounter (HOSPITAL_COMMUNITY): Payer: Self-pay | Admitting: Internal Medicine

## 2019-12-06 ENCOUNTER — Ambulatory Visit (HOSPITAL_COMMUNITY): Payer: Self-pay | Admitting: Anesthesiology

## 2019-12-06 DIAGNOSIS — D123 Benign neoplasm of transverse colon: Secondary | ICD-10-CM | POA: Insufficient documentation

## 2019-12-06 DIAGNOSIS — Z905 Acquired absence of kidney: Secondary | ICD-10-CM | POA: Insufficient documentation

## 2019-12-06 DIAGNOSIS — K5730 Diverticulosis of large intestine without perforation or abscess without bleeding: Secondary | ICD-10-CM | POA: Insufficient documentation

## 2019-12-06 DIAGNOSIS — Z85528 Personal history of other malignant neoplasm of kidney: Secondary | ICD-10-CM | POA: Insufficient documentation

## 2019-12-06 DIAGNOSIS — K621 Rectal polyp: Secondary | ICD-10-CM | POA: Insufficient documentation

## 2019-12-06 DIAGNOSIS — I10 Essential (primary) hypertension: Secondary | ICD-10-CM | POA: Insufficient documentation

## 2019-12-06 DIAGNOSIS — K635 Polyp of colon: Secondary | ICD-10-CM

## 2019-12-06 DIAGNOSIS — G4730 Sleep apnea, unspecified: Secondary | ICD-10-CM | POA: Insufficient documentation

## 2019-12-06 DIAGNOSIS — Z1211 Encounter for screening for malignant neoplasm of colon: Secondary | ICD-10-CM | POA: Insufficient documentation

## 2019-12-06 DIAGNOSIS — Z87891 Personal history of nicotine dependence: Secondary | ICD-10-CM | POA: Insufficient documentation

## 2019-12-06 DIAGNOSIS — Z8616 Personal history of COVID-19: Secondary | ICD-10-CM | POA: Insufficient documentation

## 2019-12-06 DIAGNOSIS — Z79899 Other long term (current) drug therapy: Secondary | ICD-10-CM | POA: Insufficient documentation

## 2019-12-06 HISTORY — PX: POLYPECTOMY: SHX5525

## 2019-12-06 HISTORY — PX: COLONOSCOPY WITH PROPOFOL: SHX5780

## 2019-12-06 SURGERY — COLONOSCOPY WITH PROPOFOL
Anesthesia: General

## 2019-12-06 MED ORDER — GLYCOPYRROLATE 0.2 MG/ML IJ SOLN
INTRAMUSCULAR | Status: DC | PRN
  Administered 2019-12-06: .2 mg via INTRAVENOUS

## 2019-12-06 MED ORDER — CHLORHEXIDINE GLUCONATE CLOTH 2 % EX PADS: 6.0000 | MEDICATED_PAD | Freq: Once | CUTANEOUS | Status: DC

## 2019-12-06 MED ORDER — PROPOFOL 500 MG/50ML IV EMUL
INTRAVENOUS | Status: DC | PRN
  Administered 2019-12-06: 150 ug/kg/min via INTRAVENOUS
  Administered 2019-12-06: 100 mg via INTRAVENOUS

## 2019-12-06 MED ORDER — PROPOFOL 10 MG/ML IV BOLUS
INTRAVENOUS | Status: AC
  Filled 2019-12-06: qty 80

## 2019-12-06 MED ORDER — METOPROLOL TARTRATE 5 MG/5ML IV SOLN
INTRAVENOUS | Status: AC
  Filled 2019-12-06: qty 5

## 2019-12-06 MED ORDER — LIDOCAINE HCL (CARDIAC) PF 50 MG/5ML IV SOSY
PREFILLED_SYRINGE | INTRAVENOUS | Status: DC | PRN
  Administered 2019-12-06: 100 mg via INTRAVENOUS

## 2019-12-06 MED ORDER — STERILE WATER FOR IRRIGATION IR SOLN
Status: DC | PRN
  Administered 2019-12-06: 1.5 mL

## 2019-12-06 MED ORDER — LACTATED RINGERS IV SOLN
INTRAVENOUS | Status: DC | PRN
Start: 2019-12-06 — End: 2019-12-06

## 2019-12-06 MED ORDER — METOPROLOL TARTRATE 5 MG/5ML IV SOLN
INTRAVENOUS | Status: DC | PRN
Start: 2019-12-06 — End: 2019-12-06
  Administered 2019-12-06: 1 mg via INTRAVENOUS

## 2019-12-06 MED ORDER — LACTATED RINGERS IV SOLN
Freq: Once | INTRAVENOUS | Status: AC
  Administered 2019-12-06: 1000 mL via INTRAVENOUS

## 2019-12-06 NOTE — Telephone Encounter (Signed)
Left message to return call 

## 2019-12-06 NOTE — Transfer of Care (Signed)
Immediate Anesthesia Transfer of Care Note  Patient: [name]  Procedure(s) Performed: COLONOSCOPY WITH PROPOFOL (N/A ) POLYPECTOMY  Patient Location: PACU  Anesthesia Type:General  Level of Consciousness: awake, alert , oriented and patient cooperative  Airway & Oxygen Therapy: Patient Spontanous Breathing and Patient connected to nasal cannula oxygen  Post-op Assessment: Report given to RN, Post -op Vital signs reviewed and stable and Patient moving all extremities  Post vital signs: Reviewed and stable  Last Vitals:  Vitals Value Taken Time  BP    Temp    Pulse 81 12/06/19 0916  Resp 22 12/06/19 0916  SpO2 98 % 12/06/19 0916  Vitals shown include unvalidated device data.  Last Pain:  Vitals:   12/06/19 0845  TempSrc:   PainSc: 0-No pain      Patients Stated Pain Goal: 8 (62/26/33 3545)  Complications: No complications documented.

## 2019-12-06 NOTE — Anesthesia Postprocedure Evaluation (Signed)
Anesthesia Post Note  Patient: [name]  Procedure(s) Performed: COLONOSCOPY WITH PROPOFOL (N/A ) POLYPECTOMY  Patient location during evaluation: PACU Anesthesia Type: General Level of consciousness: awake, oriented, awake and alert and patient cooperative Pain management: pain level controlled Vital Signs Assessment: post-procedure vital signs reviewed and stable Respiratory status: spontaneous breathing, respiratory function stable, nonlabored ventilation and patient connected to nasal cannula oxygen Cardiovascular status: blood pressure returned to baseline and stable Postop Assessment: no headache and no backache Anesthetic complications: no   No complications documented.   Last Vitals:  Vitals:   12/06/19 0841 12/06/19 0915  BP: (!) 175/102 (P) 116/83  Pulse:    Resp:  (P) 20  Temp:    SpO2:  (P) 98%    Last Pain:  Vitals:   12/06/19 0845  TempSrc:   PainSc: 0-No pain                 Tacy Learn

## 2019-12-06 NOTE — Telephone Encounter (Signed)
Pt returned call. Pt very groggy from having colonoscopy done. Informed pt to call back when he was not as groggy. Pt verbalized understanding.

## 2019-12-06 NOTE — Telephone Encounter (Signed)
Contacted patient. Pt states he would like to follow up with Dr.Scott next week.   Pt also states that his hernia has been bothering him also. Pt states he was out in the yard working and that is when his hernia began to bother him. Hernia is right about navel area. Pt did have some tenderness. Abdominal area where hernia is was bulged out but that has went down to normal.   Pt transferred up front to schedule appointment.

## 2019-12-06 NOTE — H&P (Signed)
@LOGO @   Primary Care Physician:  Kathyrn Drown, MD Primary Gastroenterologist:  Dr. Gala Romney  Pre-Procedure History & Physical: HPI:  [name] is a 54 y.o. male is here for a screening colonoscopy.  First ever screening examination.  Father with a history of colonic polyps (further details unknown) no GI symptoms currently.  Past Medical History:  Diagnosis Date  . Cancer ([place])    kidney  . Hypertension   . Renal disorder    partial R nephrectomy-cancer  . Sleep apnea     Past Surgical History:  Procedure Laterality Date  . ESOPHAGOGASTRODUODENOSCOPY     ruptured ulcer".  Marland Kitchen KIDNEY SURGERY Right    Partial nephrectomy d/t kidney cancer    Prior to Admission medications   Medication Sig Start Date End Date Taking? Authorizing Provider  triamterene-hydrochlorothiazide (MAXZIDE) 75-50 MG tablet Take 1 tablet by mouth daily. 01/24/19  Yes Luking, Scott A, MD  amLODipine (NORVASC) 5 MG tablet TAKE 1 TABLET (5 MG TOTAL) BY MOUTH DAILY. Patient taking differently: Take 5 mg by mouth daily.  01/24/19   Kathyrn Drown, MD  nitrofurantoin, macrocrystal-monohydrate, (MACROBID) 100 MG capsule Take 1 capsule (100 mg total) by mouth 2 (two) times daily. Patient not taking: Reported on 06/25/2019 05/18/19   Mikey Kirschner, MD  polyethylene glycol-electrolytes (TRILYTE) 420 g solution Take 4,000 mLs by mouth as directed. 04/23/19   Demetri Goshert, Cristopher Estimable, MD  polyethylene glycol-electrolytes (TRILYTE) 420 g solution Take 4,000 mLs by mouth as directed. 07/09/19   Daneil Dolin, MD    Allergies as of 07/09/2019  . (No Known Allergies)    Family History  Problem Relation Age of Onset  . Colon polyps Father   . Colon cancer Neg Hx     Social History   Socioeconomic History  . Marital status: Divorced    Spouse name: Not on file  . Number of children: Not on file  . Years of education: Not on file  . Highest education level: Not on file  Occupational History  . Not on file   Tobacco Use  . Smoking status: Former Smoker    Packs/day: 0.50    Years: 7.00    Pack years: 3.50    Types: Cigarettes    Quit date: 12/03/2004    Years since quitting: 15.0  . Smokeless tobacco: Never Used  Vaping Use  . Vaping Use: Never used  Substance and Sexual Activity  . Alcohol use: Yes    Comment: occas wine  . Drug use: No  . Sexual activity: Yes  Other Topics Concern  . Not on file  Social History Narrative  . Not on file   Social Determinants of Health   Financial Resource Strain:   . Difficulty of Paying Living Expenses:   Food Insecurity:   . Worried About Charity fundraiser in the Last Year:   . Arboriculturist in the Last Year:   Transportation Needs:   . Film/video editor (Medical):   Marland Kitchen Lack of Transportation (Non-Medical):   Physical Activity:   . Days of Exercise per Week:   . Minutes of Exercise per Session:   Stress:   . Feeling of Stress :   Social Connections:   . Frequency of Communication with Friends and Family:   . Frequency of Social Gatherings with Friends and Family:   . Attends Religious Services:   . Active Member of Clubs or Organizations:   . Attends Club or  Organization Meetings:   Marland Kitchen Marital Status:   Intimate Partner Violence:   . Fear of Current or Ex-Partner:   . Emotionally Abused:   Marland Kitchen Physically Abused:   . Sexually Abused:     Review of Systems: See HPI, otherwise negative ROS  Physical Exam: BP (!) 174/113   Pulse 96   Temp 98.7 F (37.1 C) (Oral)   Resp 17   SpO2 97%  General:   Alert,  Well-developed, well-nourished, pleasant and cooperative in NAD Lungs:  Clear throughout to auscultation.   No wheezes, crackles, or rhonchi. No acute distress. Heart:  Regular rate and rhythm; no murmurs, clicks, rubs,  or gallops. Abdomen:  Soft, nontender and nondistended. No masses, hepatosplenomegaly or hernias noted. Normal bowel sounds, without guarding, and without rebound.   Impression/Plan: [name] is  now here to undergo a screening colonoscopy.  First ever examination.  Risks, benefits, limitations, imponderables and alternatives regarding colonoscopy have been reviewed with the patient. Questions have been answered. All parties agreeable.     Notice:  This dictation was prepared with Dragon dictation along with smaller phrase technology. Any transcriptional errors that result from this process are unintentional and may not be corrected upon review.

## 2019-12-06 NOTE — Op Note (Signed)
[facility] Patient Name: [name] Procedure Date: 12/06/2019 8:36 AM MRN: [id number] Date of Birth: [birth year] Attending MD: Norvel Richards , MD CSN: [id number] Age: 54 Admit Type: Outpatient Procedure:                Colonoscopy Indications:              Screening for colorectal malignant neoplasm Providers:                Norvel Richards, MD, Charlsie Quest. Theda Sers RN, RN,                            Aram Candela Referring MD:              Medicines:                Propofol per Anesthesia Complications:            No immediate complications. Estimated Blood Loss:     Estimated blood loss was minimal. Procedure:                Pre-Anesthesia Assessment:                           - Prior to the procedure, a History and Physical                            was performed, and patient medications and                            allergies were reviewed. The patient's tolerance of                            previous anesthesia was also reviewed. The risks                            and benefits of the procedure and the sedation                            options and risks were discussed with the patient.                            All questions were answered, and informed consent                            was obtained. Prior Anticoagulants: The patient has                            taken no previous anticoagulant or antiplatelet                            agents. ASA Grade Assessment: II - A patient with                            mild systemic disease. After reviewing the risks  and benefits, the patient was deemed in                            satisfactory condition to undergo the procedure.                           After obtaining informed consent, the colonoscope                            was passed under direct vision. Throughout the                            procedure, the patient's blood pressure, pulse, and                             oxygen saturations were monitored continuously. The                            CF-HQ190L ([id number]) scope was introduced through                            the anus and advanced to the the cecum, identified                            by appendiceal orifice and ileocecal valve. The                            colonoscopy was performed without difficulty. The                            patient tolerated the procedure well. The quality                            of the bowel preparation was adequate. Scope In: 8:55:20 AM Scope Out: 9:11:48 AM Scope Withdrawal Time: 0 hours 12 minutes 57 seconds  Total Procedure Duration: 0 hours 16 minutes 28 seconds  Findings:      The perianal and digital rectal examinations were normal.      Scattered medium-mouthed diverticula were found in the sigmoid colon and       descending colon.      Three sessile polyps were found in the rectum, mid rectum and splenic       flexure. The polyps were 3 to 5 mm in size. These polyps were removed       with a cold snare. Resection and retrieval were complete. Estimated       blood loss was minimal.      The exam was otherwise without abnormality on direct and retroflexion       views. Impression:               - Diverticulosis in the sigmoid colon and in the                            descending colon.                           -  Three 3 to 5 mm polyps in the rectum, in the mid                            rectum and at the splenic flexure, removed with a                            cold snare. Resected and retrieved.                           - The examination was otherwise normal on direct                            and retroflexion views. Moderate Sedation:      Moderate (conscious) sedation was personally administered by an       anesthesia professional. The following parameters were monitored: oxygen       saturation, heart rate, blood pressure, respiratory rate, EKG, adequacy       of pulmonary ventilation, and  response to care. Recommendation:           - Patient has a contact number available for                            emergencies. The signs and symptoms of potential                            delayed complications were discussed with the                            patient. Return to normal activities tomorrow.                            Written discharge instructions were provided to the                            patient.                           - Resume previous diet.                           - Continue present medications.                           - Repeat colonoscopy date to be determined after                            pending pathology results are reviewed for                            surveillance.                           - Return to GI office (date not yet determined). Procedure Code(s):        --- Professional ---  45385, Colonoscopy, flexible; with removal of                            tumor(s), polyp(s), or other lesion(s) by snare                            technique Diagnosis Code(s):        --- Professional ---                           Z12.11, Encounter for screening for malignant                            neoplasm of colon                           K62.1, Rectal polyp                           K63.5, Polyp of colon                           K57.30, Diverticulosis of large intestine without                            perforation or abscess without bleeding CPT copyright 2019 American Medical Association. All rights reserved. The codes documented in this report are preliminary and upon coder review may  be revised to meet current compliance requirements. Cristopher Estimable. Nakeesha Bowler, MD Norvel Richards, MD 12/06/2019 10:11:57 AM This report has been signed electronically. Number of Addenda: 0

## 2019-12-06 NOTE — Anesthesia Preprocedure Evaluation (Signed)
Anesthesia Evaluation  Patient identified by MRN, date of birth, ID band Patient awake    Reviewed: Allergy & Precautions, NPO status , Patient's Chart, lab work & pertinent test results  History of Anesthesia Complications (+) history of anesthetic complications (lip laceration)  Airway Mallampati: III  TM Distance: >3 FB Neck ROM: Full    Dental  (+) Missing, Dental Advisory Given   Pulmonary sleep apnea , pneumonia (covid positive - 07/2019), former smoker,    Pulmonary exam normal breath sounds clear to auscultation       Cardiovascular Exercise Tolerance: Good hypertension, Pt. on medications Normal cardiovascular exam Rhythm:Regular Rate:Normal     Neuro/Psych    GI/Hepatic (+)     substance abuse  alcohol use,   Endo/Other  Morbid obesity  Renal/GU Renal disease (right kidney cancer, partial nephrectomy)     Musculoskeletal   Abdominal   Peds  Hematology   Anesthesia Other Findings   Reproductive/Obstetrics                             Anesthesia Physical Anesthesia Plan  ASA: III  Anesthesia Plan: General   Post-op Pain Management:    Induction: Intravenous  PONV Risk Score and Plan: 1 and Treatment may vary due to age or medical condition and TIVA  Airway Management Planned: Nasal Cannula, Natural Airway and Simple Face Mask  Additional Equipment:   Intra-op Plan:   Post-operative Plan:   Informed Consent: I have reviewed the patients History and Physical, chart, labs and discussed the procedure including the risks, benefits and alternatives for the proposed anesthesia with the patient or authorized representative who has indicated his/her understanding and acceptance.     Dental advisory given  Plan Discussed with: CRNA  Anesthesia Plan Comments:         Anesthesia Quick Evaluation

## 2019-12-06 NOTE — Telephone Encounter (Signed)
Sounds reasonable - thank you

## 2019-12-06 NOTE — Discharge Instructions (Signed)
Colonoscopy Discharge Instructions  Read the instructions outlined below and refer to this sheet in the next few weeks. These discharge instructions provide you with general information on caring for yourself after you leave the hospital. Your doctor may also give you specific instructions. While your treatment has been planned according to the most current medical practices available, unavoidable complications occasionally occur. If you have any problems or questions after discharge, call Dr. Gala Romney at [phone]. ACTIVITY  You may resume your regular activity, but move at a slower pace for the next 24 hours.   Take frequent rest periods for the next 24 hours.   Walking will help get rid of the air and reduce the bloated feeling in your belly (abdomen).   No driving for 24 hours (because of the medicine (anesthesia) used during the test).    Do not sign any important legal documents or operate any machinery for 24 hours (because of the anesthesia used during the test).  NUTRITION  Drink plenty of fluids.   You may resume your normal diet as instructed by your doctor.   Begin with a light meal and progress to your normal diet. Heavy or fried foods are harder to digest and may make you feel sick to your stomach (nauseated).   Avoid alcoholic beverages for 24 hours or as instructed.  MEDICATIONS  You may resume your normal medications unless your doctor tells you otherwise.  WHAT YOU CAN EXPECT TODAY  Some feelings of bloating in the abdomen.   Passage of more gas than usual.   Spotting of blood in your stool or on the toilet paper.  IF YOU HAD POLYPS REMOVED DURING THE COLONOSCOPY:  No aspirin products for 7 days or as instructed.   No alcohol for 7 days or as instructed.   Eat a soft diet for the next 24 hours.  FINDING OUT THE RESULTS OF YOUR TEST Not all test results are available during your visit. If your test results are not back during the visit, make an appointment  with your caregiver to find out the results. Do not assume everything is normal if you have not heard from your caregiver or the medical facility. It is important for you to follow up on all of your test results.  SEEK IMMEDIATE MEDICAL ATTENTION IF:  You have more than a spotting of blood in your stool.   Your belly is swollen (abdominal distention).   You are nauseated or vomiting.   You have a temperature over 101.   You have abdominal pain or discomfort that is severe or gets worse throughout the day.   3 polyps removed from your colon today  Diverticulosis and polyp information provided  Further recommendations to follow pending review of pathology report  At patient request, I called Ladarrius Bogdanski at [phone] -reviewed results     Colon Polyps  Polyps are tissue growths inside the body. Polyps can grow in many places, including the large intestine (colon). A polyp may be a round bump or a mushroom-shaped growth. You could have one polyp or several. Most colon polyps are noncancerous (benign). However, some colon polyps can become cancerous over time. Finding and removing the polyps early can help prevent this. What are the causes? The exact cause of colon polyps is not known. What increases the risk? You are more likely to develop this condition if you:  Have a family history of colon cancer or colon polyps.  Are older than 50 or older than 45 if  you are African American.  Have inflammatory bowel disease, such as ulcerative colitis or Crohn's disease.  Have certain hereditary conditions, such as: ? Familial adenomatous polyposis. ? Lynch syndrome. ? Turcot syndrome. ? Peutz-Jeghers syndrome.  Are overweight.  Smoke cigarettes.  Do not get enough exercise.  Drink too much alcohol.  Eat a diet that is high in fat and red meat and low in fiber.  Had childhood cancer that was treated with abdominal radiation. What are the signs or symptoms? Most polyps  do not cause symptoms. If you have symptoms, they may include:  Blood coming from your rectum when having a bowel movement.  Blood in your stool. The stool may look dark red or black.  Abdominal pain.  A change in bowel habits, such as constipation or diarrhea. How is this diagnosed? This condition is diagnosed with a colonoscopy. This is a procedure in which a lighted, flexible scope is inserted into the anus and then passed into the colon to examine the area. Polyps are sometimes found when a colonoscopy is done as part of routine cancer screening tests. How is this treated? Treatment for this condition involves removing any polyps that are found. Most polyps can be removed during a colonoscopy. Those polyps will then be tested for cancer. Additional treatment may be needed depending on the results of testing. Follow these instructions at home: Lifestyle  Maintain a healthy weight, or lose weight if recommended by your health care provider.  Exercise every day or as told by your health care provider.  Do not use any products that contain nicotine or tobacco, such as cigarettes and e-cigarettes. If you need help quitting, ask your health care provider.  If you drink alcohol, limit how much you have: ? 0-1 drink a day for women. ? 0-2 drinks a day for men.  Be aware of how much alcohol is in your drink. In the [place] one drink equals one 12 oz bottle of beer (355 mL), one 5 oz glass of wine (148 mL), or one 1 oz shot of hard liquor (44 mL). Eating and drinking   Eat foods that are high in fiber, such as fruits, vegetables, and whole grains.  Eat foods that are high in calcium and vitamin D, such as milk, cheese, yogurt, eggs, liver, fish, and broccoli.  Limit foods that are high in fat, such as fried foods and desserts.  Limit the amount of red meat and processed meat you eat, such as hot dogs, sausage, bacon, and lunch meats. General instructions  Keep all follow-up visits as  told by your health care provider. This is important. ? This includes having regularly scheduled colonoscopies. ? Talk to your health care provider about when you need a colonoscopy. Contact a health care provider if:  You have new or worsening bleeding during a bowel movement.  You have new or increased blood in your stool.  You have a change in bowel habits.  You lose weight for no known reason. Summary  Polyps are tissue growths inside the body. Polyps can grow in many places, including the colon.  Most colon polyps are noncancerous (benign), but some can become cancerous over time.  This condition is diagnosed with a colonoscopy.  Treatment for this condition involves removing any polyps that are found. Most polyps can be removed during a colonoscopy. This information is not intended to replace advice given to you by your health care provider. Make sure you discuss any questions you have with your health  care provider. Document Revised: 09/01/2017 Document Reviewed: 09/01/2017 Elsevier Patient Education  [place].     Diverticulosis  Diverticulosis is a condition that develops when small pouches (diverticula) form in the wall of the large intestine (colon). The colon is where water is absorbed and stool (feces) is formed. The pouches form when the inside layer of the colon pushes through weak spots in the outer layers of the colon. You may have a few pouches or many of them. The pouches usually do not cause problems unless they become inflamed or infected. When this happens, the condition is called diverticulitis. What are the causes? The cause of this condition is not known. What increases the risk? The following factors may make you more likely to develop this condition:  Being older than age 9. Your risk for this condition increases with age. Diverticulosis is rare among people younger than age 43. By age 28, many people have it.  Eating a low-fiber  diet.  Having frequent constipation.  Being overweight.  Not getting enough exercise.  Smoking.  Taking over-the-counter pain medicines, like aspirin and ibuprofen.  Having a family history of diverticulosis. What are the signs or symptoms? In most people, there are no symptoms of this condition. If you do have symptoms, they may include:  Bloating.  Cramps in the abdomen.  Constipation or diarrhea.  Pain in the lower left side of the abdomen. How is this diagnosed? Because diverticulosis usually has no symptoms, it is most often diagnosed during an exam for other colon problems. The condition may be diagnosed by:  Using a flexible scope to examine the colon (colonoscopy).  Taking an X-ray of the colon after dye has been put into the colon (barium enema).  Having a CT scan. How is this treated? You may not need treatment for this condition. Your health care provider may recommend treatment to prevent problems. You may need treatment if you have symptoms or if you previously had diverticulitis. Treatment may include:  Eating a high-fiber diet.  Taking a fiber supplement.  Taking a live bacteria supplement (probiotic).  Taking medicine to relax your colon. Follow these instructions at home: Medicines  Take over-the-counter and prescription medicines only as told by your health care provider.  If told by your health care provider, take a fiber supplement or probiotic. Constipation prevention Your condition may cause constipation. To prevent or treat constipation, you may need to:  Drink enough fluid to keep your urine pale yellow.  Take over-the-counter or prescription medicines.  Eat foods that are high in fiber, such as beans, whole grains, and fresh fruits and vegetables.  Limit foods that are high in fat and processed sugars, such as fried or sweet foods.  General instructions  Try not to strain when you have a bowel movement.  Keep all follow-up visits  as told by your health care provider. This is important. Contact a health care provider if you:  Have pain in your abdomen.  Have bloating.  Have cramps.  Have not had a bowel movement in 3 days. Get help right away if:  Your pain gets worse.  Your bloating becomes very bad.  You have a fever or chills, and your symptoms suddenly get worse.  You vomit.  You have bowel movements that are bloody or black.  You have bleeding from your rectum. Summary  Diverticulosis is a condition that develops when small pouches (diverticula) form in the wall of the large intestine (colon).  You may have  a few pouches or many of them.  This condition is most often diagnosed during an exam for other colon problems.  Treatment may include increasing the fiber in your diet, taking supplements, or taking medicines. This information is not intended to replace advice given to you by your health care provider. Make sure you discuss any questions you have with your health care provider. Document Revised: 12/14/2018 Document Reviewed: 12/14/2018 Elsevier Patient Education  [place] After These instructions provide you with information about caring for yourself after your procedure. Your health care provider may also give you more specific instructions. Your treatment has been planned according to current medical practices, but problems sometimes occur. Call your health care provider if you have any problems or questions after your procedure. What can I expect after the procedure? After your procedure, you may:  Feel sleepy for several hours.  Feel clumsy and have poor balance for several hours.  Feel forgetful about what happened after the procedure.  Have poor judgment for several hours.  Feel nauseous or vomit.  Have a sore throat if you had a breathing tube during the procedure. Follow these instructions at home: For at least 24 hours after  the procedure:      Have a responsible adult stay with you. It is important to have someone help care for you until you are awake and alert.  Rest as needed.  Do not: ? Participate in activities in which you could fall or become injured. ? Drive. ? Use heavy machinery. ? Drink alcohol. ? Take sleeping pills or medicines that cause drowsiness. ? Make important decisions or sign legal documents. ? Take care of children on your own. Eating and drinking  Follow the diet that is recommended by your health care provider.  If you vomit, drink water, juice, or soup when you can drink without vomiting.  Make sure you have little or no nausea before eating solid foods. General instructions  Take over-the-counter and prescription medicines only as told by your health care provider.  If you have sleep apnea, surgery and certain medicines can increase your risk for breathing problems. Follow instructions from your health care provider about wearing your sleep device: ? Anytime you are sleeping, including during daytime naps. ? While taking prescription pain medicines, sleeping medicines, or medicines that make you drowsy.  If you smoke, do not smoke without supervision.  Keep all follow-up visits as told by your health care provider. This is important. Contact a health care provider if:  You keep feeling nauseous or you keep vomiting.  You feel light-headed.  You develop a rash.  You have a fever. Get help right away if:  You have trouble breathing. Summary  For several hours after your procedure, you may feel sleepy and have poor judgment.  Have a responsible adult stay with you for at least 24 hours or until you are awake and alert. This information is not intended to replace advice given to you by your health care provider. Make sure you discuss any questions you have with your health care provider. Document Revised: 08/15/2017 Document Reviewed: 09/07/2015 Elsevier  Patient Education  [place].

## 2019-12-07 ENCOUNTER — Encounter: Payer: Self-pay | Admitting: Nurse Practitioner

## 2019-12-07 LAB — SURGICAL PATHOLOGY

## 2019-12-08 ENCOUNTER — Encounter: Payer: Self-pay | Admitting: Internal Medicine

## 2019-12-11 ENCOUNTER — Other Ambulatory Visit: Payer: Self-pay

## 2019-12-11 ENCOUNTER — Encounter: Payer: Self-pay | Admitting: Family Medicine

## 2019-12-11 ENCOUNTER — Telehealth: Payer: Self-pay | Admitting: Family Medicine

## 2019-12-11 ENCOUNTER — Ambulatory Visit (INDEPENDENT_AMBULATORY_CARE_PROVIDER_SITE_OTHER): Payer: PRIVATE HEALTH INSURANCE | Admitting: Family Medicine

## 2019-12-11 VITALS — BP 138/88 | Temp 97.4°F | Ht 65.25 in | Wt 329.8 lb

## 2019-12-11 DIAGNOSIS — R0600 Dyspnea, unspecified: Secondary | ICD-10-CM | POA: Diagnosis not present

## 2019-12-11 DIAGNOSIS — R9431 Abnormal electrocardiogram [ECG] [EKG]: Secondary | ICD-10-CM

## 2019-12-11 DIAGNOSIS — G4730 Sleep apnea, unspecified: Secondary | ICD-10-CM

## 2019-12-11 DIAGNOSIS — R0609 Other forms of dyspnea: Secondary | ICD-10-CM

## 2019-12-11 NOTE — Telephone Encounter (Signed)
Called insurance to see if the ECHO needed a prior auth & discovered that an ECHO is not a covered benefit  Do we want to schedule or just refer to cardiology?  (cardio referral has already been placed in Epic)   Please advise

## 2019-12-11 NOTE — Telephone Encounter (Signed)
Discussed with pt and he verbalized understanding. Pt also wanted to let dr scott know that the two meds he wrote down his wife called and both were covered by his insurance. Pt does not know the names of the meds but states dr Nicki Reaper knows what they are. Message also sent to brendale so she is aware to not schedule echo and to schedule referral to cardiology as urgent.  [facility]

## 2019-12-11 NOTE — Progress Notes (Signed)
   Subjective:    Patient ID: [name], male    DOB: 17-Aug-1965, 54 y.o.   MRN: [id number]  HPI Very nice patient Patient arrives to discuss results of abnormal EKG patient had a part of preop and referral to Cardiology. Abnormal EKG - Plan: ECHOCARDIOGRAM COMPLETE, Ambulatory referral to Cardiology  Sleep apnea, unspecified type - Plan: Ambulatory referral to Neurology  DOE (dyspnea on exertion) - Plan: ECHOCARDIOGRAM COMPLETE, Ambulatory referral to Cardiology Recently he had visit for colonoscopy his EKG was abnormal compared to the previous one in 2016 patient denies any chest pressure but does relate some shortness of breath with activity he denies any swelling in the legs He suffers with morbid obesity Also suffers with severe sleep apnea not using his CPAP machine because it does not fit properly patient has become frustrated about it  Review of Systems     Objective:   Physical Exam  Lungs are clear respiratory rate normal heart regular extremities no edema BP borderline  Umbilical hernia noted    Assessment & Plan:  1. Abnormal EKG Shows possible LVH along with lack of progression of Q waves across the precordial leads echo to look to see if there is any areas that are not responding well, or LVH, or decreased ejection fraction or diastolic dysfunction - ECHOCARDIOGRAM COMPLETE - Ambulatory referral to Cardiology  2. Sleep apnea, unspecified type Severe sleep apnea I encouraged him to utilize a new CPAP mask he will get this through [employer]. Also referral to Dr. Rexene Alberts for consultation - Ambulatory referral to Neurology  3. DOE (dyspnea on exertion) Referral to cardiology and will need probable Myoview stress test or possible catheterization - ECHOCARDIOGRAM COMPLETE - Ambulatory referral to Cardiology  Morbid obesity We did talk about medications that could help ease checking out a couple of the injectables Patient does not like the idea of  surgery nor could he do surgery currently until he gets his heart further checked out  Umbilical hernia hold off on any type of surgery till patient dramatically loses weight

## 2019-12-11 NOTE — Telephone Encounter (Signed)
Nurses Please talk with patient let him know what we are finding out regarding his insurance-his insurance states they will not cover echo It is not covering the echo But there is a strong possibility that all test would be covered when ordered through cardiology Therefore I recommend holding on the echo I highly recommend we move forward with urgent referral to cardiology They can order appropriate tests and work toward getting those approved on his insurance  Please then forward message to [place] for her to do consult referral ASAP thanks

## 2019-12-13 ENCOUNTER — Encounter (HOSPITAL_COMMUNITY): Payer: Self-pay | Admitting: Internal Medicine

## 2019-12-24 ENCOUNTER — Telehealth: Payer: Self-pay | Admitting: Family Medicine

## 2019-12-24 NOTE — Telephone Encounter (Signed)
Please advise. Thank you

## 2019-12-24 NOTE — Telephone Encounter (Signed)
Patient had to cancel heart doctor appointment because of family emergency and had to go out of town. He is wondering if you can get him back in sooner. He called the [facility] office and they didn't have anything until October. Please advise

## 2019-12-25 NOTE — Telephone Encounter (Signed)
[name] can see if they are able to get him into [place] office sooner Please explained to patient many offices are severely behind because of overwhelming number of patients coming to be seen after so many months of not being seen with Covid shutdown Please see what [name] can do Patient possibly can be put onto a cancellation list

## 2019-12-26 ENCOUNTER — Ambulatory Visit: Payer: Self-pay | Admitting: Cardiology

## 2020-01-01 ENCOUNTER — Ambulatory Visit (INDEPENDENT_AMBULATORY_CARE_PROVIDER_SITE_OTHER): Payer: Self-pay | Admitting: Cardiology

## 2020-01-01 ENCOUNTER — Encounter: Payer: Self-pay | Admitting: Cardiology

## 2020-01-01 VITALS — BP 128/90 | HR 77 | Ht 67.0 in | Wt 328.0 lb

## 2020-01-01 DIAGNOSIS — R0602 Shortness of breath: Secondary | ICD-10-CM

## 2020-01-01 DIAGNOSIS — R9431 Abnormal electrocardiogram [ECG] [EKG]: Secondary | ICD-10-CM

## 2020-01-01 DIAGNOSIS — I10 Essential (primary) hypertension: Secondary | ICD-10-CM

## 2020-01-01 NOTE — Patient Instructions (Signed)
Your physician recommends that you schedule a follow-up appointment in: 3 MONTHS WITH DR BRANCH  Your physician recommends that you continue on your current medications as directed. Please refer to the Current Medication list given to you today.  Your physician has requested that you have an echocardiogram. Echocardiography is a painless test that uses sound waves to create images of your heart. It provides your doctor with information about the size and shape of your heart and how well your heart's chambers and valves are working. This procedure takes approximately one hour. There are no restrictions for this procedure.   Thank you for choosing [facility]!!    

## 2020-01-01 NOTE — Progress Notes (Signed)
Clinical Summary [name] is a 54 y.o.male seen as a new consult, referred by Dr Wolfgang Phoenix for the following medical problems.   1. Abnormal EKG - noted by pcp EKG, SR with inferior and lateral precordial Twave inversions.   2. DOE - recent sedentary lifestyle  - DOE with 1-2 flights per stairs - no chest pains - some occasional LE edema that is chronic   3. OSA - mixed compliance with cpap  4. HTN - reports some mixed compliance over the years, now taking more regularly.    SH: son recently passed. Was in his 23s on dialysis, passed away suddenyl few days ago   Past Medical History:  Diagnosis Date  . Cancer ([place])    kidney  . Hypertension   . Renal disorder    partial R nephrectomy-cancer  . Sleep apnea      No Known Allergies   Current Outpatient Medications  Medication Sig Dispense Refill  . triamterene-hydrochlorothiazide (MAXZIDE) 75-50 MG tablet Take 1 tablet by mouth daily. 90 tablet 1   No current facility-administered medications for this visit.     Past Surgical History:  Procedure Laterality Date  . COLONOSCOPY WITH PROPOFOL N/A 12/06/2019   Procedure: COLONOSCOPY WITH PROPOFOL;  Surgeon: Daneil Dolin, MD;  [facility];  Service: Endoscopy;  Laterality: N/A;  1:15pm  . ESOPHAGOGASTRODUODENOSCOPY     ruptured ulcer".  Marland Kitchen KIDNEY SURGERY Right    Partial nephrectomy d/t kidney cancer  . POLYPECTOMY  12/06/2019   Procedure: POLYPECTOMY;  Surgeon: Daneil Dolin, MD;  [facility];  Service: Endoscopy;;     No Known Allergies    Family History  Problem Relation Age of Onset  . Colon polyps Father   . Colon cancer Neg Hx      Social History [name] reports that he quit smoking about 15 years ago. His smoking use included cigarettes. He has a 3.50 pack-year smoking history. He has never used smokeless tobacco. [name] reports current alcohol use.   Review of Systems CONSTITUTIONAL: No weight loss,  fever, chills, weakness or fatigue.  HEENT: Eyes: No visual loss, blurred vision, double vision or yellow sclerae.No hearing loss, sneezing, congestion, runny nose or sore throat.  SKIN: No rash or itching.  CARDIOVASCULAR: per hpi RESPIRATORY: No shortness of breath, cough or sputum.  GASTROINTESTINAL: No anorexia, nausea, vomiting or diarrhea. No abdominal pain or blood.  GENITOURINARY: No burning on urination, no polyuria NEUROLOGICAL: No headache, dizziness, syncope, paralysis, ataxia, numbness or tingling in the extremities. No change in bowel or bladder control.  MUSCULOSKELETAL: No muscle, back pain, joint pain or stiffness.  LYMPHATICS: No enlarged nodes. No history of splenectomy.  PSYCHIATRIC: No history of depression or anxiety.  ENDOCRINOLOGIC: No reports of sweating, cold or heat intolerance. No polyuria or polydipsia.  Marland Kitchen   Physical Examination Today's Vitals   01/01/20 0823  BP: 128/90  Pulse: 77  SpO2: 97%  Weight: (!) 328 lb (148.8 kg)  Height: 5\' 7"  (1.702 m)   Body mass index is 51.37 kg/m.  Gen: resting comfortably, no acute distress HEENT: no scleral icterus, pupils equal round and reactive, no palptable cervical adenopathy,  CV: RRR, no m/r/g, no jvd Resp: Clear to auscultation bilaterally GI: abdomen is soft, non-tender, non-distended, normal bowel sounds, no hepatosplenomegaly MSK: extremities are warm, no edema.  Skin: warm, no rash Neuro:  no focal deficits Psych: appropriate affect     Assessment and Plan  1. DOE - gradual progression of somewhat mild DOE, he has attributed to his weight - no chest pains. Has mild LE edema a times - abnormal EKG with TWIs inferior and lateral precordial leads - will obtain echo to evaluate for any structural heart disease  2. HTN - increased compliance with regimen, bp's reasonable today given he just took his meds this AM.       [name], M.D

## 2020-01-04 ENCOUNTER — Ambulatory Visit (HOSPITAL_COMMUNITY)
Admission: RE | Admit: 2020-01-04 | Discharge: 2020-01-04 | Disposition: A | Discharge status: Home / Self Care | Payer: Self-pay | Source: Ambulatory Visit | Attending: Cardiology | Admitting: Cardiology

## 2020-01-04 ENCOUNTER — Other Ambulatory Visit: Payer: Self-pay

## 2020-01-04 DIAGNOSIS — R0602 Shortness of breath: Secondary | ICD-10-CM | POA: Insufficient documentation

## 2020-01-04 LAB — ECHOCARDIOGRAM COMPLETE
AR max vel: 1.8 cm2
AV Area VTI: 1.68 cm2
AV Area mean vel: 1.84 cm2
AV Mean grad: 5.7 mmHg
AV Peak grad: 11 mmHg
Ao pk vel: 1.66 m/s
Area-P 1/2: 3.77 cm2
S' Lateral: 3.56 cm

## 2020-01-04 NOTE — Progress Notes (Signed)
*  PRELIMINARY RESULTS* Echocardiogram 2D Echocardiogram has been performed. Patient's blood pressure 168/95, patient reported no dizziness or headache. Suggested patient keep a week log of blood pressure and give to doctor.  [name] 01/04/2020, 9:16 AM

## 2020-01-17 ENCOUNTER — Telehealth: Payer: Self-pay | Admitting: *Deleted

## 2020-01-17 ENCOUNTER — Encounter: Payer: Self-pay | Admitting: Neurology

## 2020-01-17 ENCOUNTER — Ambulatory Visit (INDEPENDENT_AMBULATORY_CARE_PROVIDER_SITE_OTHER): Payer: PRIVATE HEALTH INSURANCE | Admitting: Neurology

## 2020-01-17 VITALS — BP 154/84 | HR 73 | Ht 67.0 in | Wt 333.3 lb

## 2020-01-17 DIAGNOSIS — G4733 Obstructive sleep apnea (adult) (pediatric): Secondary | ICD-10-CM | POA: Diagnosis not present

## 2020-01-17 DIAGNOSIS — G4719 Other hypersomnia: Secondary | ICD-10-CM

## 2020-01-17 DIAGNOSIS — R635 Abnormal weight gain: Secondary | ICD-10-CM

## 2020-01-17 DIAGNOSIS — Z6841 Body Mass Index (BMI) 40.0 and over, adult: Secondary | ICD-10-CM

## 2020-01-17 DIAGNOSIS — Z9989 Dependence on other enabling machines and devices: Secondary | ICD-10-CM

## 2020-01-17 NOTE — Telephone Encounter (Signed)
-----   Message from Arnoldo Lenis, MD sent at 01/14/2020  8:02 PM EDT ----- Echo overall looks good, normal heart function  JBranch MD

## 2020-01-17 NOTE — Progress Notes (Signed)
Subjective:    Patient ID: [name] is a 54 y.o. male.  HPI     Star Age, MD, PhD [facility] [address], [address] [place], [place] [address]  Dear Dr. Wolfgang Phoenix,   I saw your patient, [name], upon your kind request, in my Sleep clinic today for initial consultation of his sleep disorder, in particular, evaluation of his prior diagnosis of obstructive sleep apnea.  The patient is unaccompanied today.  As you know, [name] is a 54 year old right-handed gentleman with an underlying medical history of kidney cancer with partial right nephrectomy, hypertension, and morbid obesity with a BMI of over 50, who was previously diagnosed with obstructive sleep apnea and placed on CPAP therapy.  He is currently not using CPAP treatment as it has been difficult for him.  He had sleep study testing in 2013.  I was able to review his CPAP titration study from 10/14/2011.  He was placed on CPAP from 6 cm to 14 cm.  He was advised to start home CPAP therapy at a pressure of 14 cm.  Study was done at [facility] and study was interpreted by Dr. Merlene Laughter.  His Epworth sleepiness score is 8 out of 24, fatigue severity score is 33 out of 63.  He has restarted using his CPAP, he has stopped using his CPAP or was not consistent with it for a while.  His DME company is [employer] in [place].  He would be willing to get reevaluated.  He has gained weight over time, his weight at the time of his CPAP titration study in May 2013 was 290 pounds.  He is working on weight loss now.  He has a variable sleep schedule, tries to be in bed between 10 and 11 but sometimes is not in bed until 1 AM or after.  Rise time is generally between 5:30 AM and 6 AM.  He lives with his wife, he has 4 biological children, youngest son is 51 years old and stays with him some.  He has been using a full facemask and part of the concern with using CPAP was claustrophobia and anxiety  and difficulty catching his breath if the machine would turn off or he would lose power.  He would be open to exploring a different style of mask if possible. I reviewed your office note from 12/11/2019.  He quit smoking nearly 20 years ago.  He does not drink alcohol and does not drink caffeine daily.  They do have a TV on in the bedroom, this is primarily his wife's preference.  They have 2 dogs and 1 cat in the household.  I was able to review his recent CPAP compliance data.  He has been on CPAP of 14 cm, residual AHI information and leak information is not possible on this machine.  He has an S9 escape from [employer]	[place].  He has used his machine 27 out of 30 days between 12/18/2019 through 01/16/2020.  Average usage of 4 hours and 33 minutes.  His Past Medical History Is Significant For: Past Medical History:  Diagnosis Date  . Cancer ([place])    kidney  . Hypertension   . Renal disorder    partial R nephrectomy-cancer  . Sleep apnea     His Past Surgical History Is Significant For: Past Surgical History:  Procedure Laterality Date  . COLONOSCOPY WITH PROPOFOL N/A 12/06/2019   Procedure: COLONOSCOPY WITH PROPOFOL;  Surgeon: Daneil Dolin, MD;  [facility];  Service: Endoscopy;  Laterality: N/A;  1:15pm  . ESOPHAGOGASTRODUODENOSCOPY     ruptured ulcer".  Marland Kitchen KIDNEY SURGERY Right    Partial nephrectomy d/t kidney cancer  . POLYPECTOMY  12/06/2019   Procedure: POLYPECTOMY;  Surgeon: Daneil Dolin, MD;  [facility];  Service: Endoscopy;;    His Family History Is Significant For: Family History  Problem Relation Age of Onset  . Colon polyps Father   . Cancer Father   . Lupus Mother   . Colon cancer Neg Hx     His Social History Is Significant For: Social History   Socioeconomic History  . Marital status: Married    Spouse name: Not on file  . Number of children: Not on file  . Years of education: Not on file  . Highest education level: Not on file  Occupational  History  . Not on file  Tobacco Use  . Smoking status: Former Smoker    Packs/day: 0.50    Years: 7.00    Pack years: 3.50    Types: Cigarettes    Quit date: 12/03/2004    Years since quitting: 15.1  . Smokeless tobacco: Never Used  Vaping Use  . Vaping Use: Never used  Substance and Sexual Activity  . Alcohol use: Yes    Comment: occas wine  . Drug use: No  . Sexual activity: Yes  Other Topics Concern  . Not on file  Social History Narrative  . Not on file   Social Determinants of Health   Financial Resource Strain:   . Difficulty of Paying Living Expenses: Not on file  Food Insecurity:   . Worried About Charity fundraiser in the Last Year: Not on file  . Ran Out of Food in the Last Year: Not on file  Transportation Needs:   . Lack of Transportation (Medical): Not on file  . Lack of Transportation (Non-Medical): Not on file  Physical Activity:   . Days of Exercise per Week: Not on file  . Minutes of Exercise per Session: Not on file  Stress:   . Feeling of Stress : Not on file  Social Connections:   . Frequency of Communication with Friends and Family: Not on file  . Frequency of Social Gatherings with Friends and Family: Not on file  . Attends Religious Services: Not on file  . Active Member of Clubs or Organizations: Not on file  . Attends Archivist Meetings: Not on file  . Marital Status: Not on file    His Allergies Are:  No Known Allergies:   His Current Medications Are:  Outpatient Encounter Medications as of 01/17/2020  Medication Sig  . triamterene-hydrochlorothiazide (MAXZIDE) 75-50 MG tablet Take 1 tablet by mouth daily.   No facility-administered encounter medications on file as of 01/17/2020.  :  Review of Systems:  Out of a complete 14 point review of systems, all are reviewed and negative with the exception of these symptoms as listed below: Review of Systems  Neurological:       Here for sleep consult. Prior sleep study in 2013. Pt  has not been consistent with his cpap in the past but is trying to get back on tract with using. Epworth Sleepiness Scale 0= would never doze 1= slight chance of dozing 2= moderate chance of dozing 3= high chance of dozing  Sitting and reading:0 Watching TV:1 Sitting inactive in a public place (ex. Theater or meeting):0 As a passenger  in a car for an hour without a break:1 Lying down to rest in the afternoon:2 Sitting and talking to someone:2 Sitting quietly after lunch (no alcohol):1 In a car, while stopped in traffic:1 Total:8     Objective:  Neurological Exam  Physical Exam Physical Examination:   Vitals:   01/17/20 0903  BP: (!) 154/84  Pulse: 73  SpO2: 97%    General Examination: The patient is a very pleasant 54 y.o. male in no acute distress. He appears well-developed and well-nourished and well groomed.   HEENT: Normocephalic, atraumatic, pupils are equal, round and reactive to light, extraocular tracking is good without limitation to gaze excursion or nystagmus noted. Hearing is grossly intact. Face is symmetric with normal facial animation. Speech is clear with no dysarthria noted. There is no hypophonia. There is no lip, neck/head, jaw or voice tremor. Neck is supple with full range of passive and active motion. There are no carotid bruits on auscultation. Oropharynx exam reveals: mild mouth dryness, adequate dental hygiene and marked airway crowding, due to larger uvula, thicker soft palate, tonsillar size of 2-3+ and wider tongue.  Tongue protrudes centrally in palate elevates symmetrically, Mallampati class II.  Teeth alignment is slightly skewed and slight overbite is noted.  Chest: Clear to auscultation without wheezing, rhonchi or crackles noted.  Heart: S1+S2+0, regular and normal without murmurs, rubs or gallops noted.   Abdomen: Soft, non-tender and non-distended with normal bowel sounds appreciated on auscultation.  Extremities: There is 1+ pitting edema  in the distal lower extremities bilaterally.   Skin: Warm and dry without trophic changes noted.   Musculoskeletal: exam reveals no obvious joint deformities, tenderness or joint swelling or erythema.   Neurologically:  Mental status: The patient is awake, alert and oriented in all 4 spheres. His immediate and remote memory, attention, language skills and fund of knowledge are appropriate. There is no evidence of aphasia, agnosia, apraxia or anomia. Speech is clear with normal prosody and enunciation. Thought process is linear. Mood is normal and affect is normal.  Cranial nerves II - XII are as described above under HEENT exam.  Motor exam: Normal bulk, strength and tone is noted. There is no tremor, Romberg is negative. Fine motor skills and coordination: grossly intact.  Cerebellar testing: No dysmetria or intention tremor. There is no truncal or gait ataxia.  Sensory exam: intact to light touch in the upper and lower extremities.  Gait, station and balance: He stands easily. No veering to one side is noted. No leaning to one side is noted. Posture is age-appropriate and stance is narrow based. Gait shows normal stride length and normal pace. No problems turning are noted. Tandem walk is unremarkable.                Assessment and Plan:   In summary, [name] is a very pleasant 54 y.o.-year old male  with an underlying medical history of kidney cancer with partial right nephrectomy, hypertension, and morbid obesity with a BMI of over 50, who presents for evaluation of his obstructive sleep apnea.  He was originally diagnosed in 2013 and has recently restarted his CPAP of 14 cm via full facemask.  He has gained weight over time, in the realm of 40 pounds.  He is working on weight loss and is commended for his treatment adherence currently with his older CPAP machine.  He would benefit from reevaluation given the weight fluctuation as well as in order to upgrade to a new machine  and also get  fitted with a new, more tolerable interface such as one of the newer hybrid style fullface mask.   I had a long chat with the patient about my findings and the diagnosis of OSA, its prognosis and treatment options. We talked about medical treatments, surgical interventions and non-pharmacological approaches. I explained in particular the risks and ramifications of untreated moderate to severe OSA, especially with respect to developing cardiovascular disease down the Road, including congestive heart failure, difficult to treat hypertension, cardiac arrhythmias, or stroke. Even type 2 diabetes has, in part, been linked to untreated OSA. Symptoms of untreated OSA include daytime sleepiness, memory problems, mood irritability and mood disorder such as depression and anxiety, lack of energy, as well as recurrent headaches, especially morning headaches. We talked about trying to maintain a healthy lifestyle in general, as well as the importance of weight control. We also talked about the importance of good sleep hygiene. I recommended the following at this time: We will proceed with a home sleep test at this time to reevaluate his sleep apnea diagnosis and I will likely prescribe an AutoPap machine afterwards.  If need be, we can bring him in for CPAP titration study.  I explained the sleep test procedure to the patient and also outlined possible surgical and non-surgical treatment options of OSA, including the use of a custom-made dental device (which would require a referral to a specialist dentist or oral surgeon), upper airway surgical options, such as traditional UPPP or a novel less invasive surgical option in the form of Inspire hypoglossal nerve stimulation (which would involve a referral to an ENT surgeon). I also explained the CPAP treatment option to the patient, who indicated that he would be willing to continue with positive airway pressure treatment.  I explained the importance of being compliant with PAP  treatment, not only for insurance purposes but primarily to improve His symptoms, and for the patient's long term health benefit, including to reduce His cardiovascular risks. I answered all his questions today and the patient was in agreement. I plan to see him back after the sleep study is completed and encouraged him to call with any interim questions, concerns, problems or updates.   Thank you very much for allowing me to participate in the care of this nice patient. If I can be of any further assistance to you please do not hesitate to call me at [phone].  Sincerely,   Star Age, MD, PhD

## 2020-01-17 NOTE — Patient Instructions (Addendum)
Thank you for choosing [facility] for your sleep related care! It was nice to meet you today! I appreciate that you entrust me with your sleep related healthcare concerns. I hope, I was able to address at least some of your concerns today, and that I can help you feel reassured and also get better.    Here is what we discussed today and what we came up with as our plan for you:    Based on your symptoms and your exam I believe you are still at risk for obstructive sleep apnea and would benefit from reevaluation as it has been many years and you need new supplies and updated machine. Therefore, I think we should proceed with a home sleep test to determine how severe your sleep apnea is. If you have more than mild OSA, I want you to consider ongoing treatment with CPAP. Please remember, the risks and ramifications of moderate to severe obstructive sleep apnea or OSA are: Cardiovascular disease, including congestive heart failure, stroke, difficult to control hypertension, arrhythmias, and even type 2 diabetes has been linked to untreated OSA. Sleep apnea causes disruption of sleep and sleep deprivation in most cases, which, in turn, can cause recurrent headaches, problems with memory, mood, concentration, focus, and vigilance. Most people with untreated sleep apnea report excessive daytime sleepiness, which can affect their ability to drive. Please do not drive if you feel sleepy.   I will likely see you back after your sleep study to go over the test results and where to go from there. We will call you after your sleep study to advise about the results (most likely, you will hear from [place], my nurse) and to set up an appointment at the time, as necessary.    Our sleep lab administrative assistant will call you to schedule your sleep study. If you don't hear back from her by about 2 weeks from now, please feel free to call her at [phone]. You can leave a message with your phone  number and concerns, if you get the voicemail box. She will call back as soon as possible.

## 2020-01-18 NOTE — Telephone Encounter (Signed)
New message    Patient following up with [facility] , he had a message that she called him

## 2020-01-18 NOTE — Telephone Encounter (Signed)
Patient informed. Copy sent to PCP °

## 2020-01-28 ENCOUNTER — Ambulatory Visit (INDEPENDENT_AMBULATORY_CARE_PROVIDER_SITE_OTHER): Payer: PRIVATE HEALTH INSURANCE | Admitting: Family Medicine

## 2020-01-28 ENCOUNTER — Encounter: Payer: Self-pay | Admitting: Family Medicine

## 2020-01-28 ENCOUNTER — Other Ambulatory Visit: Payer: Self-pay

## 2020-01-28 VITALS — BP 148/96 | HR 97 | Temp 97.3°F | Ht 67.0 in | Wt 333.4 lb

## 2020-01-28 DIAGNOSIS — E785 Hyperlipidemia, unspecified: Secondary | ICD-10-CM

## 2020-01-28 DIAGNOSIS — I10 Essential (primary) hypertension: Secondary | ICD-10-CM

## 2020-01-28 DIAGNOSIS — Z0000 Encounter for general adult medical examination without abnormal findings: Secondary | ICD-10-CM

## 2020-01-28 MED ORDER — AMLODIPINE BESYLATE 5 MG PO TABS: 5.0000 mg | ORAL_TABLET | Freq: Every day | ORAL | 1 refills | Status: DC

## 2020-01-28 MED ORDER — WEGOVY 0.25 MG/0.5ML ~~LOC~~ SOAJ: 0.2500 mg | SUBCUTANEOUS | 0 refills | Status: DC

## 2020-01-28 MED ORDER — TRIAMTERENE-HCTZ 75-50 MG PO TABS: 1.0000 | ORAL_TABLET | Freq: Every day | ORAL | 1 refills | Status: DC

## 2020-01-28 NOTE — Progress Notes (Signed)
Subjective:    Patient ID: [name], male    DOB: Feb 16, 1966, 54 y.o.   MRN: [id number]  HPI The patient comes in today for a wellness visit. Very nice gentleman He is working hard at trying to lose weight but genetically and is been a very tough go He is on CPAP He also saw cardiology they did a work-up including echo He also had his colonoscopy. Patient is doing his best to try to work towards better health   A review of their health history was completed.  A review of medications was also completed.  Any needed refills; Maxzide  Eating habits: improving over the last couple of weeks   Falls/  MVA accidents in past few months: none  Regular exercise: walking 1-2 x per week  Specialist pt sees on regular basis: none  Preventative health issues were discussed.   Additional concerns: concerned about his weight.  Review of Systems  Constitutional: Negative for activity change, appetite change and fever.  HENT: Negative for congestion and rhinorrhea.   Eyes: Negative for discharge.  Respiratory: Negative for cough and wheezing.   Cardiovascular: Negative for chest pain.  Gastrointestinal: Negative for abdominal pain, blood in stool and vomiting.  Genitourinary: Negative for difficulty urinating and frequency.  Musculoskeletal: Negative for neck pain.  Skin: Negative for rash.  Allergic/Immunologic: Negative for environmental allergies and food allergies.  Neurological: Negative for weakness and headaches.  Psychiatric/Behavioral: Negative for agitation.       Objective:   Physical Exam Constitutional:      Appearance: He is well-developed.  HENT:     Head: Normocephalic and atraumatic.     Right Ear: External ear normal.     Left Ear: External ear normal.     Nose: Nose normal.  Eyes:     Pupils: Pupils are equal, round, and reactive to light.  Neck:     Thyroid: No thyromegaly.  Cardiovascular:     Rate and Rhythm: Normal rate and regular rhythm.      Heart sounds: Normal heart sounds. No murmur heard.   Pulmonary:     Effort: Pulmonary effort is normal. No respiratory distress.     Breath sounds: Normal breath sounds. No wheezing.  Abdominal:     General: Bowel sounds are normal. There is no distension.     Palpations: Abdomen is soft. There is no mass.     Tenderness: There is no abdominal tenderness.  Genitourinary:    Penis: Normal.   Musculoskeletal:        General: Normal range of motion.     Cervical back: Normal range of motion and neck supple.  Lymphadenopathy:     Cervical: No cervical adenopathy.  Skin:    General: Skin is warm and dry.     Findings: No erythema.  Neurological:     Mental Status: He is alert.     Motor: No abnormal muscle tone.  Psychiatric:        Behavior: Behavior normal.        Judgment: Judgment normal.     Prostate normal      Assessment & Plan:  1. Well adult exam Adult wellness-complete.wellness physical was conducted today. Importance of diet and exercise were discussed in detail.  In addition to this a discussion regarding safety was also covered. We also reviewed over immunizations and gave recommendations regarding current immunization needed for age.  In addition to this additional areas were also touched on including: Preventative health  exams needed:  Colonoscopy in 2031  Patient was advised yearly wellness exam   2. HTN (hypertension), benign Subpar control recommend adding amlodipine 5 mg daily continue diuretic follow-up in 4 to 6 weeks dietary measures discussed activity discussed  3. Morbid obesity ([place]) Very important to lose weight Looking at prescription of Wegovy I wrote him for this 0.25 mg weekly this is a high cost medicine he may not be able to afford it we did discuss how it can cause nausea vomiting diarrhea pancreatitis but the likelihood of this is low Weight loss surgery would potentially be the best option but his insurance does not cover it  4.  Hyperlipidemia, unspecified hyperlipidemia type Hyperlipidemia watch diet check labs

## 2020-01-28 NOTE — Patient Instructions (Signed)
DASH Eating Plan DASH stands for "Dietary Approaches to Stop Hypertension." The DASH eating plan is a healthy eating plan that has been shown to reduce high blood pressure (hypertension). It may also reduce your risk for type 2 diabetes, heart disease, and stroke. The DASH eating plan may also help with weight loss. What are tips for following this plan?  General guidelines  Avoid eating more than 2,300 mg (milligrams) of salt (sodium) a day. If you have hypertension, you may need to reduce your sodium intake to 1,500 mg a day.  Limit alcohol intake to no more than 1 drink a day for nonpregnant women and 2 drinks a day for men. One drink equals 12 oz of beer, 5 oz of wine, or 1 oz of hard liquor.  Work with your health care provider to maintain a healthy body weight or to lose weight. Ask what an ideal weight is for you.  Get at least 30 minutes of exercise that causes your heart to beat faster (aerobic exercise) most days of the week. Activities may include walking, swimming, or biking.  Work with your health care provider or diet and nutrition specialist (dietitian) to adjust your eating plan to your individual calorie needs. Reading food labels   Check food labels for the amount of sodium per serving. Choose foods with less than 5 percent of the Daily Value of sodium. Generally, foods with less than 300 mg of sodium per serving fit into this eating plan.  To find whole grains, look for the word "whole" as the first word in the ingredient list. Shopping  Buy products labeled as "low-sodium" or "no salt added."  Buy fresh foods. Avoid canned foods and premade or frozen meals. Cooking  Avoid adding salt when cooking. Use salt-free seasonings or herbs instead of table salt or sea salt. Check with your health care provider or pharmacist before using salt substitutes.  Do not fry foods. Cook foods using healthy methods such as baking, boiling, grilling, and broiling instead.  Cook with  heart-healthy oils, such as olive, canola, soybean, or sunflower oil. Meal planning  Eat a balanced diet that includes: ? 5 or more servings of fruits and vegetables each day. At each meal, try to fill half of your plate with fruits and vegetables. ? Up to 6-8 servings of whole grains each day. ? Less than 6 oz of lean meat, poultry, or fish each day. A 3-oz serving of meat is about the same size as a deck of cards. One egg equals 1 oz. ? 2 servings of low-fat dairy each day. ? A serving of nuts, seeds, or beans 5 times each week. ? Heart-healthy fats. Healthy fats called Omega-3 fatty acids are found in foods such as flaxseeds and coldwater fish, like sardines, salmon, and mackerel.  Limit how much you eat of the following: ? Canned or prepackaged foods. ? Food that is high in trans fat, such as fried foods. ? Food that is high in saturated fat, such as fatty meat. ? Sweets, desserts, sugary drinks, and other foods with added sugar. ? Full-fat dairy products.  Do not salt foods before eating.  Try to eat at least 2 vegetarian meals each week.  Eat more home-cooked food and less restaurant, buffet, and fast food.  When eating at a restaurant, ask that your food be prepared with less salt or no salt, if possible. What foods are recommended? The items listed may not be a complete list. Talk with your dietitian about   what dietary choices are best for you. Grains Whole-grain or whole-wheat bread. Whole-grain or whole-wheat pasta. Brown rice. Oatmeal. Quinoa. Bulgur. Whole-grain and low-sodium cereals. Pita bread. Low-fat, low-sodium crackers. Whole-wheat flour tortillas. Vegetables Fresh or frozen vegetables (raw, steamed, roasted, or grilled). Low-sodium or reduced-sodium tomato and vegetable juice. Low-sodium or reduced-sodium tomato sauce and tomato paste. Low-sodium or reduced-sodium canned vegetables. Fruits All fresh, dried, or frozen fruit. Canned fruit in natural juice (without  added sugar). Meat and other protein foods Skinless chicken or turkey. Ground chicken or turkey. Pork with fat trimmed off. Fish and seafood. Egg whites. Dried beans, peas, or lentils. Unsalted nuts, nut butters, and seeds. Unsalted canned beans. Lean cuts of beef with fat trimmed off. Low-sodium, lean deli meat. Dairy Low-fat (1%) or fat-free (skim) milk. Fat-free, low-fat, or reduced-fat cheeses. Nonfat, low-sodium ricotta or cottage cheese. Low-fat or nonfat yogurt. Low-fat, low-sodium cheese. Fats and oils Soft margarine without trans fats. Vegetable oil. Low-fat, reduced-fat, or light mayonnaise and salad dressings (reduced-sodium). Canola, safflower, olive, soybean, and sunflower oils. Avocado. Seasoning and other foods Herbs. Spices. Seasoning mixes without salt. Unsalted popcorn and pretzels. Fat-free sweets. What foods are not recommended? The items listed may not be a complete list. Talk with your dietitian about what dietary choices are best for you. Grains Baked goods made with fat, such as croissants, muffins, or some breads. Dry pasta or rice meal packs. Vegetables Creamed or fried vegetables. Vegetables in a cheese sauce. Regular canned vegetables (not low-sodium or reduced-sodium). Regular canned tomato sauce and paste (not low-sodium or reduced-sodium). Regular tomato and vegetable juice (not low-sodium or reduced-sodium). Pickles. Olives. Fruits Canned fruit in a light or heavy syrup. Fried fruit. Fruit in cream or butter sauce. Meat and other protein foods Fatty cuts of meat. Ribs. Fried meat. Bacon. Sausage. Bologna and other processed lunch meats. Salami. Fatback. Hotdogs. Bratwurst. Salted nuts and seeds. Canned beans with added salt. Canned or smoked fish. Whole eggs or egg yolks. Chicken or turkey with skin. Dairy Whole or 2% milk, cream, and half-and-half. Whole or full-fat cream cheese. Whole-fat or sweetened yogurt. Full-fat cheese. Nondairy creamers. Whipped toppings.  Processed cheese and cheese spreads. Fats and oils Butter. Stick margarine. Lard. Shortening. Ghee. Bacon fat. Tropical oils, such as coconut, palm kernel, or palm oil. Seasoning and other foods Salted popcorn and pretzels. Onion salt, garlic salt, seasoned salt, table salt, and sea salt. Worcestershire sauce. Tartar sauce. Barbecue sauce. Teriyaki sauce. Soy sauce, including reduced-sodium. Steak sauce. Canned and packaged gravies. Fish sauce. Oyster sauce. Cocktail sauce. Horseradish that you find on the shelf. Ketchup. Mustard. Meat flavorings and tenderizers. Bouillon cubes. Hot sauce and Tabasco sauce. Premade or packaged marinades. Premade or packaged taco seasonings. Relishes. Regular salad dressings. Where to find more information:  National Heart, Lung, and Blood Institute: www.nhlbi.nih.gov  American Heart Association: www.heart.org Summary  The DASH eating plan is a healthy eating plan that has been shown to reduce high blood pressure (hypertension). It may also reduce your risk for type 2 diabetes, heart disease, and stroke.  With the DASH eating plan, you should limit salt (sodium) intake to 2,300 mg a day. If you have hypertension, you may need to reduce your sodium intake to 1,500 mg a day.  When on the DASH eating plan, aim to eat more fresh fruits and vegetables, whole grains, lean proteins, low-fat dairy, and heart-healthy fats.  Work with your health care provider or diet and nutrition specialist (dietitian) to adjust your eating plan to your   individual calorie needs. This information is not intended to replace advice given to you by your health care provider. Make sure you discuss any questions you have with your health care provider. Document Revised: 04/29/2017 Document Reviewed: 05/10/2016 Elsevier Patient Education  2020 Elsevier Inc.  

## 2020-01-28 NOTE — Telephone Encounter (Signed)
I discussed this with the patient today

## 2020-01-29 LAB — HEPATIC FUNCTION PANEL
ALT: 19 IU/L (ref 0–44)
AST: 21 IU/L (ref 0–40)
Albumin: 4.5 g/dL (ref 3.8–4.9)
Alkaline Phosphatase: 74 IU/L (ref 48–121)
Bilirubin Total: 0.5 mg/dL (ref 0.0–1.2)
Bilirubin, Direct: 0.19 mg/dL (ref 0.00–0.40)
Total Protein: 7.7 g/dL (ref 6.0–8.5)

## 2020-01-29 LAB — BASIC METABOLIC PANEL
BUN/Creatinine Ratio: 12 (ref 9–20)
BUN: 13 mg/dL (ref 6–24)
CO2: 28 mmol/L (ref 20–29)
Calcium: 10 mg/dL (ref 8.7–10.2)
Chloride: 99 mmol/L (ref 96–106)
Creatinine, Ser: 1.07 mg/dL (ref 0.76–1.27)
GFR calc Af Amer: 91 mL/min/{1.73_m2} (ref >59)
GFR calc non Af Amer: 79 mL/min/{1.73_m2} (ref >59)
Glucose: 81 mg/dL (ref 65–99)
Potassium: 4.2 mmol/L (ref 3.5–5.2)
Sodium: 138 mmol/L (ref 134–144)

## 2020-01-29 LAB — LIPID PANEL
Chol/HDL Ratio: 3.2 ratio (ref 0.0–5.0)
Cholesterol, Total: 183 mg/dL (ref 100–199)
HDL: 58 mg/dL (ref >39)
LDL Chol Calc (NIH): 113 mg/dL — ABNORMAL HIGH (ref 0–99)
Triglycerides: 66 mg/dL (ref 0–149)
VLDL Cholesterol Cal: 12 mg/dL (ref 5–40)

## 2020-01-29 LAB — PSA: Prostate Specific Ag, Serum: 0.6 ng/mL (ref 0.0–4.0)

## 2020-01-31 ENCOUNTER — Other Ambulatory Visit: Payer: Self-pay | Admitting: Family Medicine

## 2020-01-31 DIAGNOSIS — E785 Hyperlipidemia, unspecified: Secondary | ICD-10-CM

## 2020-01-31 DIAGNOSIS — Z79899 Other long term (current) drug therapy: Secondary | ICD-10-CM

## 2020-01-31 DIAGNOSIS — I10 Essential (primary) hypertension: Secondary | ICD-10-CM

## 2020-01-31 MED ORDER — ROSUVASTATIN CALCIUM 5 MG PO TABS
5.0000 mg | ORAL_TABLET | Freq: Every day | ORAL | 3 refills | Status: DC
Start: 2020-01-31 — End: 2020-03-20

## 2020-02-01 ENCOUNTER — Encounter: Payer: Self-pay | Admitting: Family Medicine

## 2020-02-01 ENCOUNTER — Other Ambulatory Visit: Payer: Self-pay

## 2020-02-01 ENCOUNTER — Ambulatory Visit (INDEPENDENT_AMBULATORY_CARE_PROVIDER_SITE_OTHER): Payer: PRIVATE HEALTH INSURANCE | Admitting: Family Medicine

## 2020-02-01 VITALS — HR 78 | Temp 97.3°F | Resp 16

## 2020-02-01 DIAGNOSIS — R0981 Nasal congestion: Secondary | ICD-10-CM | POA: Insufficient documentation

## 2020-02-01 HISTORY — DX: Nasal congestion: R09.81

## 2020-02-01 NOTE — Progress Notes (Signed)
Patient ID: [name], male    DOB: Oct 15, 1965, 54 y.o.   MRN: [id number]   Chief Complaint  Patient presents with  . Cough   Subjective:    HPI  woke up with runny nose and cough.  4 days ago. Tried theraflu.    Medical History [name] has a past medical history of Cancer [facility]), Hypertension, Renal disorder, and Sleep apnea.   Outpatient Encounter Medications as of 02/01/2020  Medication Sig  . amLODipine (NORVASC) 5 MG tablet Take 1 tablet (5 mg total) by mouth daily.  . rosuvastatin (CRESTOR) 5 MG tablet Take 1 tablet (5 mg total) by mouth daily.  . Semaglutide-Weight Management (WEGOVY) 0.25 MG/0.5ML SOAJ Inject 0.5 mLs (0.25 mg total) into the skin once a week.  . triamterene-hydrochlorothiazide (MAXZIDE) 75-50 MG tablet Take 1 tablet by mouth daily.   No facility-administered encounter medications on file as of 02/01/2020.     Review of Systems  Constitutional: Negative for chills, fatigue and fever.  HENT: Positive for congestion and rhinorrhea.   Eyes: Positive for redness.       Right eye- not new problem.  Respiratory: Negative.   Cardiovascular: Negative.   Gastrointestinal: Negative.   Hematological: Negative.   Psychiatric/Behavioral: Negative.      Vitals Pulse 78   Temp (!) 97.3 F (36.3 C)   Resp 16   SpO2 98%   Objective:   Physical Exam Vitals and nursing note reviewed.  Constitutional:      Appearance: Normal appearance.  Cardiovascular:     Rate and Rhythm: Normal rate and regular rhythm.     Heart sounds: Normal heart sounds.  Pulmonary:     Effort: Pulmonary effort is normal.     Breath sounds: Normal breath sounds.  Skin:    General: Skin is warm and dry.  Neurological:     Mental Status: He is alert and oriented to person, place, and time.  Psychiatric:        Mood and Affect: Mood normal.        Behavior: Behavior normal.      Assessment and Plan   1. Head congestion - Novel Coronavirus, NAA ([facility])   [name]  presents today for an acute visit for head congestion.  This is a new problem.  Started Monday night after he fell asleep in his chair in front of the A/C.  Associated symptoms include: cough and runny nose  Pertinent negatives include: no fever, no shortness of breath, no sore throat.   Treatments tried: Theraflu  Today: he wants to be sure he does not have Covid. He is fully vaccinated and tested positive in Feb 2021. He has self-isolated and understands quarantine guidelines.   Your illness is likely caused by a virus. I recommend supportive therapy to help you to feel better.  1) Get lots of rest.  2) Take over the counter pain medication if needed, such as acetaminophen or ibuprofen. Read and follow instructions on the label and make sure not to combine other medications that may have same ingredients in it. It is important to not take too much of these ingredients.  3) Drink plenty of caffeine-free fluids. (If you have heart or kidney problems, follow the instructions of your specialist regarding amounts).  4) If you are hungry, eat a bland diet, such as the BRAT diet (bananas, rice, applesauce, toast).  5) Let us know if you are not feeling better in a week.  He is on My  Chart and will check for results through this holiday weekend.   Agrees with plan of care discussed today. Understands warning signs to seek further care: shortness of breath.  Understands to follow-up if symptoms do not improve or if anything changes.  Pecolia Ades, FNP-C   02/01/2020

## 2020-02-03 LAB — SPECIMEN STATUS REPORT

## 2020-02-03 LAB — NOVEL CORONAVIRUS, NAA: SARS-CoV-2, NAA: NOT DETECTED

## 2020-02-18 ENCOUNTER — Ambulatory Visit (INDEPENDENT_AMBULATORY_CARE_PROVIDER_SITE_OTHER): Payer: PRIVATE HEALTH INSURANCE | Admitting: Neurology

## 2020-02-18 ENCOUNTER — Other Ambulatory Visit: Payer: Self-pay

## 2020-02-18 DIAGNOSIS — G4719 Other hypersomnia: Secondary | ICD-10-CM

## 2020-02-18 DIAGNOSIS — G4733 Obstructive sleep apnea (adult) (pediatric): Secondary | ICD-10-CM | POA: Diagnosis not present

## 2020-02-18 DIAGNOSIS — Z9989 Dependence on other enabling machines and devices: Secondary | ICD-10-CM

## 2020-02-18 DIAGNOSIS — R635 Abnormal weight gain: Secondary | ICD-10-CM

## 2020-02-21 NOTE — Progress Notes (Signed)
Patient referred by Dr. Wolfgang Phoenix, seen by me on 01/17/20, HST on 02/18/20  Please call and notify the patient that the recent home sleep test showed obstructive sleep apnea in the severe range. I recommend treatment for in the form of autoPAP, which means, that we don't have to bring him in for a sleep study with CPAP, but will let him start using a so called autoPAP machine at home, through a DME company (of his choice, or as per insurance requirement). The DME representative will educate him on how to use the machine, how to put the mask on, etc. I have placed an order in the chart. Please send referral, talk to patient, send report to referring MD. We will need a FU in sleep clinic for 10 weeks post-PAP set up, please arrange that with me or one of our NPs. Thanks,   Star Age, MD, PhD [facility])

## 2020-02-21 NOTE — Procedures (Signed)
Sleep Study Report   Patient Information     First Name: Craig Last Name: Snyder ID: [id number]  Birth Date: March 02, 1966 Age: 54 y.o Gender: Male  Referring            Kathyrn Drown, MD Provider: BMI: 52.2 (W=333 lb, H=5' 7'')   Epworth:  8/24   Sleep Study Information    Study Date: 02/18/20 S/H/A Version: [id number] / [id number] / 54  History:    54 year old man with a history of kidney cancer with partial right nephrectomy, hypertension, and morbid obesity with a BMI of over 50, who was previously diagnosed with obstructive sleep apnea and placed on CPAP therapy.  He is currently not using CPAP treatment as it has been difficult for him.  Summary & Diagnosis:    OSA  Recommendations:     This home sleep test demonstrates severe obstructive sleep apnea with a total AHI of 38/hour and O2 nadir of 85%. Snoring appeared to be loud and fairly consistent. Treatment with positive airway pressure is recommended. The patient will be advised to proceed with an autoPAP titration/trial at home for now.  A full night titration study for optimization of his treatment settings can be considered down the road if needed.  Please note that untreated obstructive sleep apnea may carry additional perioperative morbidity. Patients with significant obstructive sleep apnea should receive perioperative PAP therapy and the surgeons and particularly the anesthesiologist should be informed of the diagnosis and the severity of the sleep disordered breathing. The patient should be cautioned not to drive, work at heights, or operate dangerous or heavy equipment when tired or sleepy. Review and reiteration of good sleep hygiene measures should be pursued with any patient. Other causes of the patient's symptoms, including circadian rhythm disturbances, an underlying mood disorder, medication effect and/or an underlying medical problem cannot be ruled out based on this test. Clinical correlation is recommended. The patient and  his referring provider will be notified of the test results. The patient will be seen in follow up in sleep clinic at [facility].  I certify that I have reviewed the raw data recording prior to the issuance of this report in accordance with the standards of the American Academy of Sleep Medicine (AASM).  Star Age, MD, PhD [facility]) Diplomat, ABPN (Neurology and Sleep)          Sleep Summary  Oxygen Saturation Statistics   Start Study Time: End Study Time: Total Recording Time:  9:14:29 PM 7:24:08 AM 10 h, 9 min  Total Sleep Time % REM of Sleep Time:  6 h, 46 min  16.7    Mean: 92 Minimum: 85 Maximum: 97  Mean of Desaturations Nadirs (%):   90  Oxygen Desaturation. %:  4-9 10-20 >20 Total  Events Number Total   37  2 94.9 5.1  0 0.0  39 100.0  Oxygen Saturation: <90 <=88 <85 <80 <70  Duration (minutes): Sleep % 2.7 0.7 1.4 0.0 0.3 0.0 0.0 0.0 0.0 0.0     Respiratory Indices      Total Events REM NREM All Night  pRDI: pAHI 3%: ODI 4%: pAHIc 3%: % CSR: pAHI 4%:  82  82  39  9 0.0 41 72.0 72.0 50.8 21.2 33.8 33.8 14.0 2.1 38.0 38.0 18.1 4.2 19.0       Pulse Rate Statistics during Sleep (BPM)      Mean: 74 Minimum: N/A Maximum: 107    Indices are calculated using technically  valid sleep time of 2 h, 9 min.                                                          pAHI=38.0                                                                      Mild              Moderate                    Severe                                                 5              15                    30   Body Position Statistics  Position Supine Prone Right Left Non-Supine  Sleep (min) 244.0 103.1 0.0 59.0 162.1  Sleep % 60.1 25.4 0.0 14.5 39.9  pRDI 40.7 29.7 N/A N/A 31.2  pAHI 3% 40.7 29.7 N/A N/A 31.2  ODI 4% 21.3 7.9 N/A N/A 9.8            Left   Prone  Supine    Snoring Statistics Snoring Level (dB) >40 >50 >60  >70 >80 >Threshold (45)  Sleep (min) 352.1 243.2 61.9 0.0 0.0 284.0  Sleep % 86.7 59.9 15.2 0.0 0.0 69.9    Mean: 52 dB Sleep Stages Chart                    Wake  Sleep      Wake  %  33.38    Sleep  66.62  %   Total:  100.00  %                                                          REM  Light  Deep      REM  16.65  %    Light  %  64.51    Deep  18.84  %   Total:  100.00  %                                 Sleep/Wake States  Sleep Stages  Sleep Latency (min):  REM Latency (min):  Number of Wakes:   42   101   36

## 2020-02-21 NOTE — Addendum Note (Signed)
Addended by: Star Age on: 02/21/2020 07:44 AM   Modules accepted: Orders

## 2020-03-03 ENCOUNTER — Ambulatory Visit: Payer: PRIVATE HEALTH INSURANCE | Admitting: Family Medicine

## 2020-03-20 ENCOUNTER — Ambulatory Visit (INDEPENDENT_AMBULATORY_CARE_PROVIDER_SITE_OTHER): Payer: PRIVATE HEALTH INSURANCE | Admitting: Family Medicine

## 2020-03-20 ENCOUNTER — Encounter: Payer: Self-pay | Admitting: Family Medicine

## 2020-03-20 ENCOUNTER — Other Ambulatory Visit: Payer: Self-pay

## 2020-03-20 VITALS — BP 132/90 | HR 93 | Temp 97.0°F | Ht 67.0 in | Wt 315.0 lb

## 2020-03-20 DIAGNOSIS — Z23 Encounter for immunization: Secondary | ICD-10-CM | POA: Diagnosis not present

## 2020-03-20 DIAGNOSIS — E785 Hyperlipidemia, unspecified: Secondary | ICD-10-CM | POA: Diagnosis not present

## 2020-03-20 DIAGNOSIS — I10 Essential (primary) hypertension: Secondary | ICD-10-CM

## 2020-03-20 MED ORDER — ROSUVASTATIN CALCIUM 5 MG PO TABS: 5.0000 mg | ORAL_TABLET | Freq: Every day | ORAL | 5 refills | Status: DC

## 2020-03-20 MED ORDER — WEGOVY 0.25 MG/0.5ML ~~LOC~~ SOAJ: 0.2500 mg | SUBCUTANEOUS | 5 refills | Status: DC

## 2020-03-20 NOTE — Patient Instructions (Signed)
[name] may cause some nausea the first few weeks but if you tolerate it then we can go up on the dose If severe abdominal pain then stop the meds and recheck with [place] ASAP Please send [place] updates Recheck here in 3 months

## 2020-03-20 NOTE — Progress Notes (Signed)
   Subjective:    Patient ID: [name], male    DOB: 1965/06/21, 54 y.o.   MRN: [id number]  HPIfollow up on hyperlipidemia. Pt states pharm never got rx for crestor so he has not yet started med. Patient relates he needs medicine sent to the pharmacy Pt also states pharm never received wegovy injection for weight loss.  Patient relates he needs a medicine sent to the pharmacy Has not been taking amlodipine because he states he left it over his mom's house.  Has been taking his diuretic. Would like flu vaccine today.  Suffers with morbid obesity watching portions and staying active Medications were sent to the pharmacy but for some reason they state that they did not get them so we will resend them today thank you  Review of Systems  Constitutional: Negative for activity change.  HENT: Negative for congestion and rhinorrhea.   Respiratory: Negative for cough and shortness of breath.   Cardiovascular: Negative for chest pain.  Gastrointestinal: Negative for abdominal pain, diarrhea, nausea and vomiting.  Genitourinary: Negative for dysuria and hematuria.  Neurological: Negative for weakness and headaches.  Psychiatric/Behavioral: Negative for behavioral problems and confusion.       Objective:   Physical Exam Vitals reviewed.  Cardiovascular:     Rate and Rhythm: Normal rate and regular rhythm.     Heart sounds: Normal heart sounds. No murmur heard.   Pulmonary:     Effort: Pulmonary effort is normal.     Breath sounds: Normal breath sounds.  Lymphadenopathy:     Cervical: No cervical adenopathy.  Neurological:     Mental Status: He is alert.  Psychiatric:        Behavior: Behavior normal.           Assessment & Plan:  Refills were sent to his pharmacy  1. HTN (hypertension), benign Blood pressure good control with the diuretic combination.  Not on amlodipine currently.  Watch diet watch portions try to lose weight  2. Hyperlipidemia, unspecified  hyperlipidemia type Very important to try to get this down medications was sent in again to his pharmacy apparently did not receive the first time  We will check lab work again by spring time    3. Morbid obesity ([place]) Watch portions stay active try to lose weight  Prescription was sent in that the pharmacy apparently never got [facility] proper way to take the medicine was discussed  Warning signs regarding side effects as well as severe abdominal pain discussed.  4. Need for vaccination Flu vaccine - Flu Vaccine QUAD 6+ mos PF IM (Fluarix Quad PF)  Patient was encouraged not to do the homeopathic treatments he was doing for weight loss that was costing a lot  Patient to send [place] update in the next month how the medication is doing otherwise follow-up within 3 to 4 months

## 2020-04-03 ENCOUNTER — Ambulatory Visit (INDEPENDENT_AMBULATORY_CARE_PROVIDER_SITE_OTHER): Payer: PRIVATE HEALTH INSURANCE | Admitting: Cardiology

## 2020-04-03 ENCOUNTER — Encounter: Payer: Self-pay | Admitting: Cardiology

## 2020-04-03 ENCOUNTER — Other Ambulatory Visit: Payer: Self-pay

## 2020-04-03 VITALS — BP 137/88 | HR 75 | Ht 67.0 in | Wt 315.0 lb

## 2020-04-03 DIAGNOSIS — R0600 Dyspnea, unspecified: Secondary | ICD-10-CM | POA: Diagnosis not present

## 2020-04-03 DIAGNOSIS — I10 Essential (primary) hypertension: Secondary | ICD-10-CM | POA: Diagnosis not present

## 2020-04-03 DIAGNOSIS — R0609 Other forms of dyspnea: Secondary | ICD-10-CM

## 2020-04-03 NOTE — Patient Instructions (Signed)
Medication Instructions:  Your physician recommends that you continue on your current medications as directed. Please refer to the Current Medication list given to you today.  *If you need a refill on your cardiac medications before your next appointment, please call your pharmacy*   Lab Work: None today If you have labs (blood work) drawn today and your tests are completely normal, you will receive your results only by: . MyChart Message (if you have MyChart) OR . A paper copy in the mail If you have any lab test that is abnormal or we need to change your treatment, we will call you to review the results.   Testing/Procedures: None today   Follow-Up: At [facility], you and your health needs are our priority.  As part of our continuing mission to provide you with exceptional heart care, we have created designated Provider Care Teams.  These Care Teams include your primary Cardiologist (physician) and Advanced Practice Providers (APPs -  Physician Assistants and Nurse Practitioners) who all work together to provide you with the care you need, when you need it.  We recommend signing up for the patient portal called "MyChart".  Sign up information is provided on this After Visit Summary.  MyChart is used to connect with patients for Virtual Visits (Telemedicine).  Patients are able to view lab/test results, encounter notes, upcoming appointments, etc.  Non-urgent messages can be sent to your provider as well.   To learn more about what you can do with MyChart, go to [email or web address].    Your next appointment:   6 month(s)  The format for your next appointment:   In Person  Provider:   Jonathan Branch, MD   Other Instructions None       Thank you for choosing [facility] !         

## 2020-04-03 NOTE — Progress Notes (Signed)
Clinical Summary Mr. Archey is a 54 y.o.male seen today for follow up of the following medical problems.     1. Abnormal EKG - noted by pcp EKG, SR with inferior and lateral precordial Twave inversions.   2. DOE - recent sedentary lifestyle  - DOE with 1-2 flights per stairs - no chest pains - some occasional LE edema that is chronic   12/2019 echo LVEF 55-60%,  indet DDx -breathing is improving, increasing physical activity.Has lost 15 lbs since last visit     3. OSA - mixed compliance with cpap - followed by Dr Rexene Alberts - recent home sleep study showed severe OSA    4. HTN - compliant with meds   SH: son recently passed. Was in his 72s on dialysis, passed away suddenyl few days ago  Moderna covid vaccine x 3.    He is from [place], [place].   Past Medical History:  Diagnosis Date  . Cancer ([place])    kidney  . Hypertension   . Renal disorder    partial R nephrectomy-cancer  . Sleep apnea      No Known Allergies   Current Outpatient Medications  Medication Sig Dispense Refill  . amLODipine (NORVASC) 5 MG tablet Take 1 tablet (5 mg total) by mouth daily. (Patient not taking: Reported on 03/20/2020) 90 tablet 1  . rosuvastatin (CRESTOR) 5 MG tablet Take 1 tablet (5 mg total) by mouth daily. 30 tablet 5  . Semaglutide-Weight Management (WEGOVY) 0.25 MG/0.5ML SOAJ Inject 0.25 mg into the skin once a week. 2 mL 5  . triamterene-hydrochlorothiazide (MAXZIDE) 75-50 MG tablet Take 1 tablet by mouth daily. 90 tablet 1   No current facility-administered medications for this visit.     Past Surgical History:  Procedure Laterality Date  . COLONOSCOPY WITH PROPOFOL N/A 12/06/2019   Procedure: COLONOSCOPY WITH PROPOFOL;  Surgeon: Daneil Dolin, MD;  [facility];  Service: Endoscopy;  Laterality: N/A;  1:15pm  . ESOPHAGOGASTRODUODENOSCOPY     ruptured ulcer".  Marland Kitchen KIDNEY SURGERY Right    Partial nephrectomy d/t kidney cancer  . POLYPECTOMY   12/06/2019   Procedure: POLYPECTOMY;  Surgeon: Daneil Dolin, MD;  Location[facility];  Service: Endoscopy;;     No Known Allergies    Family History  Problem Relation Age of Onset  . Colon polyps Father   . Cancer Father   . Lupus Mother   . Colon cancer Neg Hx      Social History Mr. Heffron reports that he quit smoking about 15 years ago. His smoking use included cigarettes. He has a 3.50 pack-year smoking history. He has never used smokeless tobacco. Mr. Beadnell reports current alcohol use.   Review of Systems CONSTITUTIONAL: No weight loss, fever, chills, weakness or fatigue.  HEENT: Eyes: No visual loss, blurred vision, double vision or yellow sclerae.No hearing loss, sneezing, congestion, runny nose or sore throat.  SKIN: No rash or itching.  CARDIOVASCULAR: per hpi RESPIRATORY: No shortness of breath, cough or sputum.  GASTROINTESTINAL: No anorexia, nausea, vomiting or diarrhea. No abdominal pain or blood.  GENITOURINARY: No burning on urination, no polyuria NEUROLOGICAL: No headache, dizziness, syncope, paralysis, ataxia, numbness or tingling in the extremities. No change in bowel or bladder control.  MUSCULOSKELETAL: No muscle, back pain, joint pain or stiffness.  LYMPHATICS: No enlarged nodes. No history of splenectomy.  PSYCHIATRIC: No history of depression or anxiety.  ENDOCRINOLOGIC: No reports of sweating, cold or heat intolerance. No  polyuria or polydipsia.  Marland Kitchen   Physical Examination Today's Vitals   04/03/20 0839  BP: 137/88  Pulse: 75  SpO2: 95%  Weight: (!) 315 lb (142.9 kg)  Height: 5\' 7"  (1.702 m)   Body mass index is 49.34 kg/m.  Gen: resting comfortably, no acute distress HEENT: no scleral icterus, pupils equal round and reactive, no palptable cervical adenopathy,  CV: RRR, no m/r/g, no jvd Resp: Clear to auscultation bilaterally GI: abdomen is soft, non-tender, non-distended, normal bowel sounds, no hepatosplenomegaly MSK: extremities  are warm, no edema.  Skin: warm, no rash Neuro:  no focal deficits Psych: appropriate affect   Diagnostic Studies 12/2019 echo IMPRESSIONS    1. Left ventricular ejection fraction, by estimation, is 55 to 60%. The  left ventricle has normal function. Left ventricular endocardial border  not optimally defined to evaluate regional wall motion. There is mild left  ventricular hypertrophy. Left  ventricular diastolic parameters are indeterminate.  2. Right ventricular systolic function is normal. The right ventricular  size is normal.  3. The mitral valve is normal in structure. No evidence of mitral valve  regurgitation. No evidence of mitral stenosis.  4. The aortic valve has an indeterminant number of cusps. Aortic valve  regurgitation is not visualized. No aortic stenosis is present.  5. The inferior vena cava is normal in size with greater than 50%  respiratory variability, suggesting right atrial pressure of 3 mmHg.     Assessment and Plan  1. DOE - symptoms have improved with weight loss, overall benign echo. I think his nonspecific EKG changes are secondary to mild LVH. Would not pursue and additional testing at this time.   2. HTN - slightly above goal. He has lost 15 lbs and is working to restart his home cpap. We discussed ongoing lifestyle modification vs additional medication, at this time continue to monitor bp's with ongoing lifestyle modification and weight loss  F/u 6 months     Arnoldo Lenis, M.D.

## 2020-06-27 ENCOUNTER — Other Ambulatory Visit: Payer: PRIVATE HEALTH INSURANCE

## 2020-06-28 ENCOUNTER — Other Ambulatory Visit: Payer: PRIVATE HEALTH INSURANCE

## 2020-08-02 ENCOUNTER — Emergency Department (HOSPITAL_COMMUNITY)
Admission: EM | Admit: 2020-08-02 | Discharge: 2020-08-02 | Disposition: A | Discharge status: Home / Self Care | Payer: PRIVATE HEALTH INSURANCE | Attending: Emergency Medicine | Admitting: Emergency Medicine

## 2020-08-02 ENCOUNTER — Other Ambulatory Visit: Payer: Self-pay

## 2020-08-02 ENCOUNTER — Emergency Department (HOSPITAL_COMMUNITY): Payer: PRIVATE HEALTH INSURANCE

## 2020-08-02 ENCOUNTER — Encounter (HOSPITAL_COMMUNITY): Payer: Self-pay | Admitting: Emergency Medicine

## 2020-08-02 DIAGNOSIS — Z79899 Other long term (current) drug therapy: Secondary | ICD-10-CM | POA: Diagnosis not present

## 2020-08-02 DIAGNOSIS — M542 Cervicalgia: Secondary | ICD-10-CM | POA: Diagnosis not present

## 2020-08-02 DIAGNOSIS — R0789 Other chest pain: Secondary | ICD-10-CM | POA: Insufficient documentation

## 2020-08-02 DIAGNOSIS — I10 Essential (primary) hypertension: Secondary | ICD-10-CM | POA: Diagnosis not present

## 2020-08-02 DIAGNOSIS — M79602 Pain in left arm: Secondary | ICD-10-CM | POA: Diagnosis not present

## 2020-08-02 DIAGNOSIS — R079 Chest pain, unspecified: Secondary | ICD-10-CM | POA: Diagnosis present

## 2020-08-02 DIAGNOSIS — Z87891 Personal history of nicotine dependence: Secondary | ICD-10-CM | POA: Diagnosis not present

## 2020-08-02 DIAGNOSIS — Z85528 Personal history of other malignant neoplasm of kidney: Secondary | ICD-10-CM | POA: Diagnosis not present

## 2020-08-02 HISTORY — DX: Acute myocardial infarction, unspecified: I21.9

## 2020-08-02 LAB — BASIC METABOLIC PANEL
Anion gap: 10 (ref 5–15)
BUN: 15 mg/dL (ref 6–20)
CO2: 25 mmol/L (ref 22–32)
Calcium: 9.3 mg/dL (ref 8.9–10.3)
Chloride: 101 mmol/L (ref 98–111)
Creatinine, Ser: 1.27 mg/dL — ABNORMAL HIGH (ref 0.61–1.24)
GFR, Estimated: >60 mL/min (ref >60)
Glucose, Bld: 121 mg/dL — ABNORMAL HIGH (ref 70–99)
Potassium: 3.5 mmol/L (ref 3.5–5.1)
Sodium: 136 mmol/L (ref 135–145)

## 2020-08-02 LAB — CBC
HCT: 46.2 % (ref 39.0–52.0)
Hemoglobin: 15.3 g/dL (ref 13.0–17.0)
MCH: 27.6 pg (ref 26.0–34.0)
MCHC: 33.1 g/dL (ref 30.0–36.0)
MCV: 83.4 fL (ref 80.0–100.0)
Platelets: 214 10*3/uL (ref 150–400)
RBC: 5.54 MIL/uL (ref 4.22–5.81)
RDW: 13.8 % (ref 11.5–15.5)
WBC: 9.3 10*3/uL (ref 4.0–10.5)
nRBC: 0 % (ref 0.0–0.2)

## 2020-08-02 LAB — TROPONIN I (HIGH SENSITIVITY)
Troponin I (High Sensitivity): 18 ng/L — ABNORMAL HIGH (ref <18)
Troponin I (High Sensitivity): 17 ng/L (ref <18)

## 2020-08-02 MED ORDER — KETOROLAC TROMETHAMINE 30 MG/ML IJ SOLN
30.0000 mg | Freq: Once | INTRAMUSCULAR | Status: AC
  Administered 2020-08-02: 30 mg via INTRAVENOUS
  Filled 2020-08-02: qty 1

## 2020-08-02 NOTE — ED Provider Notes (Signed)
[facility] EMERGENCY DEPARTMENT Provider Note   CSN: [id number] Arrival date & time: 08/02/20  0118     History Chief Complaint  Patient presents with  . Chest Pain    [name] is a 55 y.o. male.  Patient is a 55 year old male with history of "silent MI", hypertension, hyperlipidemia, obesity.  Patient presents today for evaluation of chest discomfort.  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  He describes blood pressures of the 160s over 110s.  Shortly after returning home, he started to experience tightness to the left side of his chest radiating into his neck and arm.  He felt somewhat short of breath, but denies nausea or diaphoresis.  Patient denies having had a prior heart cath or stents.  The history is provided by the patient.       Past Medical History:  Diagnosis Date  . Cancer ([place])    kidney  . Hypertension   . MI (myocardial infarction) ([place])   . Renal disorder    partial R nephrectomy-cancer  . Sleep apnea     Patient Active Problem List   Diagnosis Date Noted  . Head congestion 02/01/2020  . Preventative health care 04/23/2019  . Ventral hernia without obstruction or gangrene 02/04/2017  . Hyperlipidemia 01/13/2015  . HTN (hypertension), benign 11/09/2013  . Morbid obesity ([place]) 11/09/2013    Past Surgical History:  Procedure Laterality Date  . COLONOSCOPY WITH PROPOFOL N/A 12/06/2019   Procedure: COLONOSCOPY WITH PROPOFOL;  Surgeon: Daneil Dolin, MD;  [facility];  Service: Endoscopy;  Laterality: N/A;  1:15pm  . ESOPHAGOGASTRODUODENOSCOPY     ruptured ulcer".  Marland Kitchen KIDNEY SURGERY Right    Partial nephrectomy d/t kidney cancer  . POLYPECTOMY  12/06/2019   Procedure: POLYPECTOMY;  Surgeon: Daneil Dolin, MD;  [facility];  Service: Endoscopy;;       Family History  Problem Relation Age of Onset  . Colon polyps Father   . Cancer Father   . Lupus Mother   .  Colon cancer Neg Hx     Social History   Tobacco Use  . Smoking status: Former Smoker    Packs/day: 0.50    Years: 7.00    Pack years: 3.50    Types: Cigarettes    Quit date: 12/03/2004    Years since quitting: 15.6  . Smokeless tobacco: Never Used  Vaping Use  . Vaping Use: Never used  Substance Use Topics  . Alcohol use: Yes    Comment: occas wine  . Drug use: No    Home Medications Prior to Admission medications   Medication Sig Start Date End Date Taking? Authorizing Provider  rosuvastatin (CRESTOR) 5 MG tablet Take 1 tablet (5 mg total) by mouth daily. 03/20/20   Kathyrn Drown, MD  triamterene-hydrochlorothiazide (MAXZIDE) 75-50 MG tablet Take 1 tablet by mouth daily. 01/28/20   Kathyrn Drown, MD    Allergies    Patient has no known allergies.  Review of Systems   Review of Systems  All other systems reviewed and are negative.   Physical Exam Updated Vital Signs BP (!) 188/96 (BP Location: Left Arm)   Pulse 93   Temp 100.2 F (37.9 C) (Oral)   Resp 19   Ht 5\' 7"  (1.702 m)   Wt (!) 143.8 kg   SpO2 99%   BMI 49.65 kg/m   Physical Exam Vitals and nursing note reviewed.  Constitutional:      General: He is not in acute distress.    Appearance: He is well-developed and well-nourished. He is not diaphoretic.  HENT:     Head: Normocephalic and atraumatic.     Mouth/Throat:     Mouth: Oropharynx is clear and moist.  Cardiovascular:     Rate and Rhythm: Normal rate and regular rhythm.     Heart sounds: No murmur heard. No friction rub.  Pulmonary:     Effort: Pulmonary effort is normal. No respiratory distress.     Breath sounds: Normal breath sounds. No wheezing or rales.  Abdominal:     General: Bowel sounds are normal. There is no distension.     Palpations: Abdomen is soft.     Tenderness: There is no abdominal tenderness.  Musculoskeletal:        General: No edema. Normal range of motion.     Cervical back: Normal range of motion and neck  supple.     Right lower leg: No tenderness. No edema.     Left lower leg: No tenderness. No edema.  Skin:    General: Skin is warm and dry.  Neurological:     Mental Status: He is alert and oriented to person, place, and time.     Coordination: Coordination normal.     ED Results / Procedures / Treatments   Labs (all labs ordered are listed, but only abnormal results are displayed) Labs Reviewed  CBC  BASIC METABOLIC PANEL  TROPONIN I (HIGH SENSITIVITY)    EKG ED ECG REPORT   Date: 08/02/2020  Rate: 93  Rhythm: normal sinus rhythm  QRS Axis: left  Intervals: normal  ST/T Wave abnormalities: normal  Conduction Disutrbances:none  Narrative Interpretation:   Old EKG Reviewed: unchanged  I have personally reviewed the EKG tracing and agree with the computerized printout as noted.      Radiology No results found.  Procedures Procedures   Medications Ordered in ED Medications  ketorolac (TORADOL) 30 MG/ML injection 30 mg (has no administration in time range)    ED Course  I have reviewed the triage vital signs and the nursing notes.  Pertinent labs & imaging results that were available during my care of the patient were reviewed by me and considered in my medical decision making (see chart for details).    MDM Rules/Calculators/A&P  Patient presenting here with complaints of chest discomfort as described in the HPI.  He has history of what he describes as a silent heart attack, but denies ever having had a heart cath or stents.  Patient's initial EKG is unchanged and troponin x2 is negative.  His symptoms are reproducible with palpation of the anterior chest wall and seemed to improve after receiving Toradol.  Patient has been observed here for several hours and is now feeling better.  At this point, I feel as though discharge is appropriate.  Patient will be allowed to go home, but is to return if he worsens and should follow-up with his cardiologist next  week.  Final Clinical Impression(s) / ED Diagnoses Final diagnoses:  None    Rx / DC Orders ED Discharge Orders    None       Veryl Speak, MD 08/02/20 [id number]

## 2020-08-02 NOTE — ED Triage Notes (Signed)
Pt with c/o  L sided chest pain x 1 hr that he states radiates up his neck and down his L arm.

## 2020-08-02 NOTE — Discharge Instructions (Addendum)
Take ibuprofen 600 mg every 6 hours as needed for pain.  Continue other medications as previously prescribed.  Follow-up with your cardiologist next week, and return to the emergency department if you develop worsening pain, difficulty breathing, or other new and concerning symptoms.

## 2020-08-04 ENCOUNTER — Telehealth: Payer: Self-pay | Admitting: *Deleted

## 2020-08-04 NOTE — Telephone Encounter (Signed)
Transition Care Management Unsuccessful Follow-up Telephone Call  Date of discharge and from where:  3-5 [facility]  Attempts:  1st attempt  Reason for unsuccessful TCM follow-up call:  No answer/ LM

## 2020-08-05 ENCOUNTER — Telehealth: Payer: Self-pay | Admitting: *Deleted

## 2020-08-05 NOTE — Telephone Encounter (Signed)
Transition Care Management Unsuccessful Follow-up Telephone Call  Date of discharge and from where:  08/02/2020 - [facility]  Attempts:  2nd Attempt  Reason for unsuccessful TCM follow-up call:  Left voice message

## 2020-08-06 NOTE — Telephone Encounter (Signed)
Transition Care Management Unsuccessful Follow-up Telephone Call  Date of discharge and from where:  08/02/2020 - [name]  Attempts:  3rd Attempt  Reason for unsuccessful TCM follow-up call:  Left voice message

## 2020-10-03 ENCOUNTER — Ambulatory Visit (INDEPENDENT_AMBULATORY_CARE_PROVIDER_SITE_OTHER): Payer: PRIVATE HEALTH INSURANCE | Admitting: Family Medicine

## 2020-10-03 ENCOUNTER — Ambulatory Visit: Payer: PRIVATE HEALTH INSURANCE | Admitting: Cardiology

## 2020-10-03 ENCOUNTER — Encounter: Payer: Self-pay | Admitting: Family Medicine

## 2020-10-03 VITALS — BP 164/108 | HR 64 | Ht 67.0 in | Wt 327.0 lb

## 2020-10-03 DIAGNOSIS — Z9989 Dependence on other enabling machines and devices: Secondary | ICD-10-CM

## 2020-10-03 DIAGNOSIS — R0600 Dyspnea, unspecified: Secondary | ICD-10-CM

## 2020-10-03 DIAGNOSIS — I10 Essential (primary) hypertension: Secondary | ICD-10-CM

## 2020-10-03 DIAGNOSIS — R0609 Other forms of dyspnea: Secondary | ICD-10-CM

## 2020-10-03 DIAGNOSIS — G4733 Obstructive sleep apnea (adult) (pediatric): Secondary | ICD-10-CM

## 2020-10-03 NOTE — Patient Instructions (Addendum)
Your physician recommends that you schedule a follow-up appointment in: [place], NP  Your physician recommends that you continue on your current medications as directed. Please refer to the Current Medication list given to you today.  TAKE MAXZIDE WHEN YOU GET HOME AND CALL us THIS AFTERNOON WITH BLOOD PRESSURE READINGS  Thank you for choosing [facility]!!

## 2020-10-03 NOTE — Progress Notes (Signed)
Cardiology Office Note  Date: 10/03/2020   ID: [name], DOB 1965/06/29, MRN [id number]  PCP:  Kathyrn Drown, MD  Cardiologist:  Carlyle Dolly, MD Electrophysiologist:  None   Chief Complaint: Follow up.  History of Present Illness: [name] is a 55 y.o. male with a history of HTN,   He was last seen by Dr. Harl Bowie on 04/03/2020 for follow-up of abnormal EKG noted by PCP.  EKG was sinus rhythm with inferior and lateral precordial T wave inversions.  Having some DOE with sedentary lifestyle.  DOE with 1-2 flights of stairs.  No chest pain, occasional lower extremity edema that was chronic.  Echocardiogram August 2021 LVEF 55 to 60%.  Indeterminate diastolic function.  Breathing was improving with physical activity he has lost approximately 15 pounds since prior visit.  He admits compliant with his CPAP and followed by catheter.  Recent home sleep study showed severe OSA.  He was compliant with his antihypertensive medication.  There were no changes to antihypertensive medications.  He was planning to restart his home CPAP.    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  Described blood pressures as 160s over 110s.  Shortly after returning home he started experiencing tightness to the left side of his chest radiation to neck.  Felt somewhat short of breath.  Initial EKG was unchanged and troponins x2 were negative.  His symptoms were reproducible with palpation of the anterior chest wall and seem to improve after Toradol.  He was observed for several hours and eventually discharged.  He is here for 56-month follow-up today.  He denies any anginal or exertional symptoms although he had a recent visit to [facility] emergency room for chest discomfort.  Blood pressure was elevated.  On arrival today his blood pressure is elevated at 164/108.  He has not taken his  Maxide today.  He has history of morbid obesity and history of partial nephrectomy due to kidney cancer in the past.  He denies any subsequent chest discomfort since emergency room visit.  History of morbid obesity OSA.  He is receiving his CPAP soon and will start his CPAP therapy.  States he has had a recent change in jobs from a stressful job doing much easier to.  Has some recent weight gain since last visit.  Today he weighs 327 pounds.  He states has been suggested that he have bariatric surgery i.e. gastric bypass or gastric sleeve surgery.  He states he is ready to undergo this type of surgery at this point.  He states he previously was taking his Maxide at night.  According to nursing staff he was on a statin medication and has since stopped.   Past Medical History:  Diagnosis Date  . Cancer ([place])    kidney  . Hypertension   . MI (myocardial infarction) ([place])   . Renal disorder    partial R nephrectomy-cancer  . Sleep apnea     Past Surgical History:  Procedure Laterality Date  . COLONOSCOPY WITH PROPOFOL N/A 12/06/2019   Procedure: COLONOSCOPY WITH PROPOFOL;  Surgeon: Daneil Dolin, MD;  [facility];  Service: Endoscopy;  Laterality: N/A;  1:15pm  . ESOPHAGOGASTRODUODENOSCOPY     ruptured ulcer".  Marland Kitchen KIDNEY SURGERY Right    Partial nephrectomy d/t kidney cancer  . POLYPECTOMY  12/06/2019   Procedure: POLYPECTOMY;  Surgeon:  Daneil Dolin, MD;  [facility];  Service: Endoscopy;;    Current Outpatient Medications  Medication Sig Dispense Refill  . triamterene-hydrochlorothiazide (MAXZIDE) 75-50 MG tablet Take 1 tablet by mouth daily. 90 tablet 1   No current facility-administered medications for this visit.   Allergies:  Patient has no known allergies.   Social History: The patient  reports that he quit smoking about 15 years ago. His smoking use included cigarettes. He has a 3.50 pack-year smoking history. He has never used smokeless tobacco. He  reports current alcohol use. He reports that he does not use drugs.   Family History: The patient's family history includes Cancer in his father; Colon polyps in his father; Lupus in his mother.   ROS:  Please see the history of present illness. Otherwise, complete review of systems is positive for none.  All other systems are reviewed and negative.   Physical Exam: VS:  BP (!) 164/108   Pulse 64   Ht 5\' 7"  (1.702 m)   Wt (!) 327 lb (148.3 kg)   SpO2 95%   BMI 51.22 kg/m , BMI Body mass index is 51.22 kg/m.  Wt Readings from Last 3 Encounters:  10/03/20 (!) 327 lb (148.3 kg)  08/02/20 (!) 317 lb (143.8 kg)  04/03/20 (!) 315 lb (142.9 kg)    General: Morbidly obese patient appears comfortable at rest. Neck: Supple, no elevated JVP or carotid bruits, no thyromegaly. Lungs: Clear to auscultation, nonlabored breathing at rest. Cardiac: Regular rate and rhythm, no S3 or significant systolic murmur, no pericardial rub. Extremities: No pitting edema, distal pulses 2+. Skin: Warm and dry. Musculoskeletal: No kyphosis. Neuropsychiatric: Alert and oriented x3, affect grossly appropriate.  ECG:  EKG 08/02/2020 sinus rhythm rate of 93, borderline left axis deviation, anteroseptal infarct, old  Recent Labwork: 01/28/2020: ALT 19; AST 21 08/02/2020: BUN 15; Creatinine, Ser 1.27; Hemoglobin 15.3; Platelets 214; Potassium 3.5; Sodium 136     Component Value Date/Time   CHOL 183 01/28/2020 1144   TRIG 66 01/28/2020 1144   HDL 58 01/28/2020 1144   CHOLHDL 3.2 01/28/2020 1144   CHOLHDL 3.4 01/28/2014 1211   VLDL 13 01/28/2014 1211   LDLCALC 113 (H) 01/28/2020 1144    Other Studies Reviewed Today:   12/2019 echo IMPRESSIONS  1. Left ventricular ejection fraction, by estimation, is 55 to 60%. The  left ventricle has normal function. Left ventricular endocardial border  not optimally defined to evaluate regional wall motion. There is mild left  ventricular hypertrophy. Left  ventricular  diastolic parameters are indeterminate.  2. Right ventricular systolic function is normal. The right ventricular  size is normal.  3. The mitral valve is normal in structure. No evidence of mitral valve  regurgitation. No evidence of mitral stenosis.  4. The aortic valve has an indeterminant number of cusps. Aortic valve  regurgitation is not visualized. No aortic stenosis is present.  5. The inferior vena cava is normal in size with greater than 50%  respiratory variability, suggesting right atrial pressure of 3 mmHg.    Assessment and Plan:   1. DOE (dyspnea on exertion)   2. HTN (hypertension), benign   3. OSA on CPAP     1. DOE (dyspnea on exertion) Has some DOE which she relates to increased weight and sedentary lifestyle.  Also has sleep apnea had stopped CPAP in the past but plans to restart soon when he gets his new machine.  Last echocardiogram August 2021 demonstrated EF of  55 to 60%.  Mild LVH.  Indeterminate diastolic parameters.  DOE is likely related to morbid obesity, obesity hypoventilation syndrome, sedentary lifestyle and deconditioning.  He states he is attempting to increase his walking activity for weight loss  2. HTN (hypertension), benign Blood pressure elevated 164/108 today.  States she is not taking his Maxide today.  Advised him he needs to take his Maxide as soon as he gets home today and call us later in the day with the blood pressure.  If blood pressure remains elevated we may need to start an additional agent possibly amlodipine.  He states he is leaving on Monday for [employer].  Advised we need to get blood pressure under control before he leaves.  Pending.  3. OSA on CPAP History of OSA on CPAP in the past with mixed compliance.  States he will be receiving a new CPAP machine and plans to restart CPAP.  Advised compliance.  Medication Adjustments/Labs and Tests Ordered: Current medicines are reviewed at length with the patient today.  Concerns  regarding medicines are outlined above.   Disposition: Follow-up with Dr. Harl Bowie or APP in 2 weeks  Signed, Levell July, NP 10/03/2020 1:20 PM    [facility] [place], [place], [place] [address] Phone: [phone]; Fax: [phone]

## 2020-10-27 NOTE — Progress Notes (Signed)
Cardiology Office Note  Date: 10/27/2020   ID: DIANGELO RADEL, DOB [birth year], MRN [id number]  PCP:  Kathyrn Drown, MD  Cardiologist:  Carlyle Dolly, MD Electrophysiologist:  None   Chief Complaint: Follow up.  History of Present Illness: [name] is a 55 y.o. male with a history of HTN,   He was last seen by Dr. Harl Bowie on 04/03/2020 for follow-up of abnormal EKG noted by PCP.  EKG was sinus rhythm with inferior and lateral precordial T wave inversions.  Having some DOE with sedentary lifestyle.  DOE with 1-2 flights of stairs.  No chest pain, occasional lower extremity edema that was chronic.  Echocardiogram August 2021 LVEF 55 to 60%.  Indeterminate diastolic function.  Breathing was improving with physical activity he has lost approximately 15 pounds since prior visit.  He admits compliant with his CPAP and followed by catheter.  Recent home sleep study showed severe OSA.  He was compliant with his antihypertensive medication.  There were no changes to antihypertensive medications.  He was planning to restart his home CPAP.    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  Described blood pressures as 160s over 110s.  Shortly after returning home he started experiencing tightness to the left side of his chest radiation to neck.  Felt somewhat short of breath.  Initial EKG was unchanged and troponins x2 were negative.  His symptoms were reproducible with palpation of the anterior chest wall and seem to improve after Toradol.  He was observed for several hours and eventually discharged.  He was last here for 1-month follow-up.  He denied any anginal or exertional symptoms.  He had a recent visit to [facility] emergency room for chest discomfort.  Blood pressure was elevated.  On arrival his blood pressure was elevated at 164/108.  He had not taken his Maxide.   History of morbid obesity and history of partial nephrectomy due to kidney cancer in the past.  He denied any subsequent chest discomfort since emergency room visit.   He was receiving his CPAP soon and will start his CPAP therapy.  States he has had a recent change in jobs from a stressful job doing much easier to.  Had some recent weight gain since last visit.  He weighed 327 pounds.  It had been suggested that he have bariatric surgery i.e. gastric bypass or gastric sleeve surgery.  He stated he was ready to undergo this type of surgery at this point.  He was previously taking his Maxide at night.  According to nursing staff he was on a statin medication and had since stopped.  He is here for follow-up on his hypertension today.  Blood pressure has improved some since last visit but still not at goal.  BP today 158/98.  He continues to take his Maxide at night.  He states he knows he needs to lose weight.  He is morbidly obese and BMI is 50.9 with weight of 325.  He denies any anginal or exertional symptoms.  Denies any palpitations or arrhythmias, orthostatic symptoms, CVA or TIA-like symptoms, PND, orthopnea.  Denies any bleeding issues, claudication-like symptoms, DVT or PE-like symptoms, or lower extremity edema.   Past Medical History:  Diagnosis Date  . Cancer ([place])    kidney  . Hypertension   . MI (myocardial infarction) ([place])   . Renal disorder  partial R nephrectomy-cancer  . Sleep apnea     Past Surgical History:  Procedure Laterality Date  . COLONOSCOPY WITH PROPOFOL N/A 12/06/2019   Procedure: COLONOSCOPY WITH PROPOFOL;  Surgeon: Daneil Dolin, MD;  [facility];  Service: Endoscopy;  Laterality: N/A;  1:15pm  . ESOPHAGOGASTRODUODENOSCOPY     ruptured ulcer".  [facility] Kitchen KIDNEY SURGERY Right    Partial nephrectomy d/t kidney cancer  . POLYPECTOMY  12/06/2019   Procedure: POLYPECTOMY;  Surgeon: Daneil Dolin, MD;  [facility];  Service: Endoscopy;;    Current  Outpatient Medications  Medication Sig Dispense Refill  . triamterene-hydrochlorothiazide (MAXZIDE) 75-50 MG tablet Take 1 tablet by mouth daily. 90 tablet 1   No current facility-administered medications for this visit.   Allergies:  Patient has no known allergies.   Social History: The patient  reports that he quit smoking about 15 years ago. His smoking use included cigarettes. He has a 3.50 pack-year smoking history. He has never used smokeless tobacco. He reports current alcohol use. He reports that he does not use drugs.   Family History: The patient's family history includes Cancer in his father; Colon polyps in his father; Lupus in his mother.   ROS:  Please see the history of present illness. Otherwise, complete review of systems is positive for none.  All other systems are reviewed and negative.   Physical Exam: VS:  There were no vitals taken for this visit., BMI There is no height or weight on file to calculate BMI.  Wt Readings from Last 3 Encounters:  10/03/20 (!) 327 lb (148.3 kg)  08/02/20 (!) 317 lb (143.8 kg)  04/03/20 (!) 315 lb (142.9 kg)    General: Morbidly obese patient appears comfortable at rest. Neck: Supple, no elevated JVP or carotid bruits, no thyromegaly. Lungs: Clear to auscultation, nonlabored breathing at rest. Cardiac: Regular rate and rhythm, no S3 or significant systolic murmur, no pericardial rub. Extremities: No pitting edema, distal pulses 2+. Skin: Warm and dry. Musculoskeletal: No kyphosis. Neuropsychiatric: Alert and oriented x3, affect grossly appropriate.  ECG:  EKG 08/02/2020 sinus rhythm rate of 93, borderline left axis deviation, anteroseptal infarct, old  Recent Labwork: 01/28/2020: ALT 19; AST 21 08/02/2020: BUN 15; Creatinine, Ser 1.27; Hemoglobin 15.3; Platelets 214; Potassium 3.5; Sodium 136     Component Value Date/Time   CHOL 183 01/28/2020 1144   TRIG 66 01/28/2020 1144   HDL 58 01/28/2020 1144   CHOLHDL 3.2 01/28/2020 1144    CHOLHDL 3.4 01/28/2014 1211   VLDL 13 01/28/2014 1211   LDLCALC 113 (H) 01/28/2020 1144    Other Studies Reviewed Today:   12/2019 echo IMPRESSIONS  1. Left ventricular ejection fraction, by estimation, is 55 to 60%. The  left ventricle has normal function. Left ventricular endocardial border  not optimally defined to evaluate regional wall motion. There is mild left  ventricular hypertrophy. Left  ventricular diastolic parameters are indeterminate.  2. Right ventricular systolic function is normal. The right ventricular  size is normal.  3. The mitral valve is normal in structure. No evidence of mitral valve  regurgitation. No evidence of mitral stenosis.  4. The aortic valve has an indeterminant number of cusps. Aortic valve  regurgitation is not visualized. No aortic stenosis is present.  5. The inferior vena cava is normal in size with greater than 50%  respiratory variability, suggesting right atrial pressure of 3 mmHg.    Assessment and Plan:   1. DOE (dyspnea on  exertion)   2. HTN (hypertension), benign   3. OSA on CPAP     1. DOE (dyspnea on exertion) Has some DOE which he relates to increased weight and sedentary lifestyle.  Also has sleep apnea had stopped CPAP in the past but plans to restart soon when he gets his new machine.  Last echocardiogram August 2021 demonstrated EF of 55 to 60%.  Mild LVH.  Indeterminate diastolic parameters.  DOE is likely related to morbid obesity, obesity hypoventilation syndrome, sedentary lifestyle and deconditioning.  He states he is attempting to increase his walking activity for weight loss.  2. HTN (hypertension), benign Blood pressure elevated 158/98 today.  He continues to take his Maxide at night   Advised him he needs to take his Maxide in the mornings.  Start amlodipine 5 mg daily.  Start checking your blood pressures daily for the next 2 weeks after starting amlodipine and call at the end of 2 weeks with blood pressure  measurements.  3. OSA on CPAP History of OSA on CPAP in the past with mixed compliance.  States he will be receiving a new CPAP machine and plans to restart CPAP.  Advised compliance.  Medication Adjustments/Labs and Tests Ordered: Current medicines are reviewed at length with the patient today.  Concerns regarding medicines are outlined above.   Disposition: Follow-up with Dr. Harl Bowie or APP in 1 month  Signed, Levell July, NP 10/27/2020 8:45 PM    [facility] [place], [place], [place] [address] Phone: [phone]; Fax: [phone]

## 2020-10-28 ENCOUNTER — Encounter: Payer: Self-pay | Admitting: Family Medicine

## 2020-10-28 ENCOUNTER — Other Ambulatory Visit: Payer: Self-pay

## 2020-10-28 ENCOUNTER — Ambulatory Visit (INDEPENDENT_AMBULATORY_CARE_PROVIDER_SITE_OTHER): Payer: Self-pay | Admitting: Family Medicine

## 2020-10-28 VITALS — BP 158/98 | HR 86 | Ht 67.0 in | Wt 325.0 lb

## 2020-10-28 DIAGNOSIS — I10 Essential (primary) hypertension: Secondary | ICD-10-CM

## 2020-10-28 DIAGNOSIS — G4733 Obstructive sleep apnea (adult) (pediatric): Secondary | ICD-10-CM

## 2020-10-28 DIAGNOSIS — R0609 Other forms of dyspnea: Secondary | ICD-10-CM

## 2020-10-28 DIAGNOSIS — R0600 Dyspnea, unspecified: Secondary | ICD-10-CM

## 2020-10-28 DIAGNOSIS — Z9989 Dependence on other enabling machines and devices: Secondary | ICD-10-CM

## 2020-10-28 MED ORDER — TRIAMTERENE-HCTZ 75-50 MG PO TABS: 1.0000 | ORAL_TABLET | Freq: Every day | ORAL | 3 refills | Status: DC

## 2020-10-28 MED ORDER — AMLODIPINE BESYLATE 10 MG PO TABS
10.0000 mg | ORAL_TABLET | Freq: Every day | ORAL | 6 refills | Status: DC
Start: 2020-10-28 — End: 2021-03-12

## 2020-10-28 NOTE — Patient Instructions (Addendum)
Medication Instructions:   Begin Amlodipine 10mg  daily.   Continue all other medications.    Labwork: none  Testing/Procedures: none  Follow-Up: 1 month  Any Other Special Instructions Will Be Listed Below (If Applicable). Please call the office in 2 weeks with update on blood pressure readings.   If you need a refill on your cardiac medications before your next appointment, please call your pharmacy.  PATIENT CALLED THE OFFICE BACK TO REPORT THAT HIS MAXZIDE WAS A COUPLE OF YEARS OLD & THAT HE HAD ONLY BEEN TAKING IT OCCASIONALLY.  PER ANDY - HOLD ON THE AMLODIPINE RX FOR NOW.  WILL REFILL HIS MAXZIDE & SEE WHAT BP LOOKS LIKE IN A FEW WEEKS WHEN HE CALLS WITH UPDATED READINGS --agh

## 2020-10-28 NOTE — Addendum Note (Signed)
Addended by: Laurine Blazer on: 10/28/2020 04:44 PM   Modules accepted: Orders

## 2020-11-20 ENCOUNTER — Other Ambulatory Visit: Payer: Self-pay

## 2020-11-20 ENCOUNTER — Telehealth (INDEPENDENT_AMBULATORY_CARE_PROVIDER_SITE_OTHER): Payer: BC Managed Care – PPO | Admitting: Family Medicine

## 2020-11-20 ENCOUNTER — Telehealth: Payer: Self-pay | Admitting: *Deleted

## 2020-11-20 DIAGNOSIS — J019 Acute sinusitis, unspecified: Secondary | ICD-10-CM | POA: Diagnosis not present

## 2020-11-20 DIAGNOSIS — I10 Essential (primary) hypertension: Secondary | ICD-10-CM

## 2020-11-20 DIAGNOSIS — R059 Cough, unspecified: Secondary | ICD-10-CM | POA: Diagnosis not present

## 2020-11-20 DIAGNOSIS — Z131 Encounter for screening for diabetes mellitus: Secondary | ICD-10-CM

## 2020-11-20 DIAGNOSIS — E785 Hyperlipidemia, unspecified: Secondary | ICD-10-CM

## 2020-11-20 DIAGNOSIS — Z125 Encounter for screening for malignant neoplasm of prostate: Secondary | ICD-10-CM

## 2020-11-20 MED ORDER — AMOXICILLIN-POT CLAVULANATE 875-125 MG PO TABS
1.0000 | ORAL_TABLET | Freq: Two times a day (BID) | ORAL | 0 refills | Status: DC
Start: 2020-11-20 — End: 2021-03-02

## 2020-11-20 NOTE — Progress Notes (Addendum)
   Subjective:  Initially this was a virtual visit but then patient was having so much coughing I brought him to the office he was seen in person  Patient ID: [name], male    DOB: 09-10-1965, 55 y.o.   MRN: [id number]  Cough This is a new problem. The current episode started 1 to 4 weeks ago.  Patient with coughing congestion not feeling good for about 3 to 4 weeks Covid test negative  Review of Systems  Respiratory:  Positive for cough.       Objective:   Physical Exam Cough was noted no crackles no rales no respiratory distress eardrums normal mild sinus tenderness Virtual Visit via Telephone Note  I connected with Lavona Mound on 11/20/20 at  3:00 PM EDT by telephone and verified that I am speaking with the correct person using two identifiers.  Location: Patient: Office Provider: Office   I discussed the limitations, risks, security and privacy concerns of performing an evaluation and management service by telephone and the availability of in person appointments. I also discussed with the patient that there may be a patient responsible charge related to this service. The patient expressed understanding and agreed to proceed.   History of Present Illness:    Observations/Objective:   Assessment and Plan:   Follow Up Instructions:    I discussed the assessment and treatment plan with the patient. The patient was provided an opportunity to ask questions and all were answered. The patient agreed with the plan and demonstrated an understanding of the instructions.   The patient was advised to call back or seek an in-person evaluation if the symptoms worsen or if the condition fails to improve as anticipated.  COVID test taken was not felt that the patient was having any serious issue such as pneumonia.  Warning signs discussed follow-up if problems     Assessment & Plan:  Acute rhinosinusitis Antibiotics prescribed warning signs discussed Follow-up if  progressive troubles  Screening labs for wellness visit ordered patient will follow-up for wellness in the near future

## 2020-11-20 NOTE — Telephone Encounter (Signed)
Mr. [name], [name] are scheduled for a virtual visit with your provider today.    Just as we do with appointments in the office, we must obtain your consent to participate.  Your consent will be active for this visit and any virtual visit you may have with one of our providers in the next 365 days.    If you have a MyChart account, I can also send a copy of this consent to you electronically.  All virtual visits are billed to your insurance company just like a traditional visit in the office.  As this is a virtual visit, video technology does not allow for your provider to perform a traditional examination.  This may limit your provider's ability to fully assess your condition.  If your provider identifies any concerns that need to be evaluated in person or the need to arrange testing such as labs, EKG, etc, we will make arrangements to do so.    Although advances in technology are sophisticated, we cannot ensure that it will always work on either your end or our end.  If the connection with a video visit is poor, we may have to switch to a telephone visit.  With either a video or telephone visit, we are not always able to ensure that we have a secure connection.   I need to obtain your verbal consent now.   Are you willing to proceed with your visit today?   [name] has provided verbal consent on 11/20/2020 for a virtual visit (video or telephone).   Mitzie Na, RN 11/20/2020  1:36 PM

## 2020-11-21 DIAGNOSIS — G4733 Obstructive sleep apnea (adult) (pediatric): Secondary | ICD-10-CM | POA: Diagnosis not present

## 2020-11-21 LAB — SARS-COV-2, NAA 2 DAY TAT

## 2020-11-21 LAB — NOVEL CORONAVIRUS, NAA: SARS-CoV-2, NAA: NOT DETECTED

## 2020-11-27 NOTE — Progress Notes (Signed)
Cardiology Office Note  Date: 11/28/2020   ID: [name], DOB 10-Sep-1965, MRN [id number]  PCP:  Kathyrn Drown, MD  Cardiologist:  Carlyle Dolly, MD Electrophysiologist:  None   Chief Complaint: Follow up.  History of Present Illness: [name] is a 55 y.o. male with a history of HTN,   He was last seen by Dr. Harl Bowie on 04/03/2020 for follow-up of abnormal EKG noted by PCP.  EKG was sinus rhythm with inferior and lateral precordial T wave inversions.  Having some DOE with sedentary lifestyle.  DOE with 1-2 flights of stairs.  No chest pain, occasional lower extremity edema that was chronic.  Echocardiogram August 2021 LVEF 55 to 60%.  Indeterminate diastolic function.  Breathing was improving with physical activity he has lost approximately 15 pounds since prior visit.  He admits compliant with his CPAP and followed by catheter.  Recent home sleep study showed severe OSA.  He was compliant with his antihypertensive medication.  There were no changes to antihypertensive medications.  He was planning to restart his home CPAP.   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  Described blood pressures as 160s over 110s.  Shortly after returning home he started experiencing tightness to the left side of his chest radiation to neck.  Felt somewhat short of breath.  Initial EKG was unchanged and troponins x2 were negative.  His symptoms were reproducible with palpation of the anterior chest wall and seem to improve after Toradol.  He was observed for several hours and eventually discharged.  He was last here for 7-month follow-up.  He denied any anginal or exertional symptoms.  He had a recent visit to [facility] emergency room for chest discomfort.  Blood pressure was elevated.  On arrival his blood pressure was elevated at 164/108.  He had not taken his Maxide.  History  of morbid obesity and history of partial nephrectomy due to kidney cancer in the past.  He denied any subsequent chest discomfort since emergency room visit.   He was receiving his CPAP soon and plan to start CPAP therapy.  He had a recent change in jobs from a stressful job doing much easier to.  Had some recent weight gain since last visit.  He weighed 327 pounds.  It had been suggested that he have bariatric surgery i.e. gastric bypass or gastric sleeve surgery.  He stated he was ready to undergo this type of surgery at this point.  He was previously taking his Maxide at night.  According to nursing staff he was on a statin medication and had since stopped.  He is here for follow-up after starting amlodipine.  Blood pressures have improved significantly.  Blood pressure today 130/80.  He also states he received his new CPAP machine and is compliant.  States he is working on losing weight with increasing his walking activity along with decreasing red meats in his diet and modifying dietary habits.  He denies any anginal or exertional symptoms.  No significant DOE but likely has obesity hypoventilation syndrome.  Denies any palpitations or arrhythmias, orthostatic symptoms, CVA or TIA-like symptoms.   Past Medical History:  Diagnosis Date   Cancer [facility])    kidney   Hypertension    MI (myocardial infarction) ([place])    Renal disorder    partial R nephrectomy-cancer   Sleep apnea  Past Surgical History:  Procedure Laterality Date   COLONOSCOPY WITH PROPOFOL N/A 12/06/2019   Procedure: COLONOSCOPY WITH PROPOFOL;  Surgeon: Daneil Dolin, MD;  [facility];  Service: Endoscopy;  Laterality: N/A;  1:15pm   ESOPHAGOGASTRODUODENOSCOPY     ruptured ulcer".   KIDNEY SURGERY Right    Partial nephrectomy d/t kidney cancer   POLYPECTOMY  12/06/2019   Procedure: POLYPECTOMY;  Surgeon: Daneil Dolin, MD;  [facility];  Service: Endoscopy;;    Current Outpatient Medications   Medication Sig Dispense Refill   amLODipine (NORVASC) 10 MG tablet Take 1 tablet (10 mg total) by mouth daily. 30 tablet 6   amoxicillin-clavulanate (AUGMENTIN) 875-125 MG tablet Take 1 tablet by mouth 2 (two) times daily. 20 tablet 0   triamterene-hydrochlorothiazide (MAXZIDE) 75-50 MG tablet Take 1 tablet by mouth daily. 90 tablet 3   No current facility-administered medications for this visit.   Allergies:  Patient has no known allergies.   Social History: The patient  reports that he quit smoking about 15 years ago. His smoking use included cigarettes. He has a 3.50 pack-year smoking history. He has never used smokeless tobacco. He reports current alcohol use. He reports that he does not use drugs.   Family History: The patient's family history includes Cancer in his father; Colon polyps in his father; Lupus in his mother.   ROS:  Please see the history of present illness. Otherwise, complete review of systems is positive for none.  All other systems are reviewed and negative.   Physical Exam: VS:  BP 130/80   Pulse 88   Ht 5\' 7"  (1.702 m)   Wt (!) 325 lb 6.4 oz (147.6 kg)   SpO2 97%   BMI 50.96 kg/m , BMI Body mass index is 50.96 kg/m.  Wt Readings from Last 3 Encounters:  11/28/20 (!) 325 lb 6.4 oz (147.6 kg)  10/28/20 (!) 325 lb (147.4 kg)  10/03/20 (!) 327 lb (148.3 kg)    General: Morbidly obese patient appears comfortable at rest. Neck: Supple, no elevated JVP or carotid bruits, no thyromegaly. Lungs: Clear to auscultation, nonlabored breathing at rest. Cardiac: Regular rate and rhythm, no S3 or significant systolic murmur, no pericardial rub. Extremities: No pitting edema, distal pulses 2+. Skin: Warm and dry. Musculoskeletal: No kyphosis. Neuropsychiatric: Alert and oriented x3, affect grossly appropriate.  ECG:  EKG 08/02/2020 sinus rhythm rate of 93, borderline left axis deviation, anteroseptal infarct, old  Recent Labwork: 01/28/2020: ALT 19; AST 21 08/02/2020:  BUN 15; Creatinine, Ser 1.27; Hemoglobin 15.3; Platelets 214; Potassium 3.5; Sodium 136     Component Value Date/Time   CHOL 183 01/28/2020 1144   TRIG 66 01/28/2020 1144   HDL 58 01/28/2020 1144   CHOLHDL 3.2 01/28/2020 1144   CHOLHDL 3.4 01/28/2014 1211   VLDL 13 01/28/2014 1211   LDLCALC 113 (H) 01/28/2020 1144    Other Studies Reviewed Today:   12/2019 echo IMPRESSIONS   1. Left ventricular ejection fraction, by estimation, is 55 to 60%. The  left ventricle has normal function. Left ventricular endocardial border  not optimally defined to evaluate regional wall motion. There is mild left  ventricular hypertrophy. Left  ventricular diastolic parameters are indeterminate.   2. Right ventricular systolic function is normal. The right ventricular  size is normal.   3. The mitral valve is normal in structure. No evidence of mitral valve  regurgitation. No evidence of mitral stenosis.   4. The aortic valve has  an indeterminant number of cusps. Aortic valve  regurgitation is not visualized. No aortic stenosis is present.   5. The inferior vena cava is normal in size with greater than 50%  respiratory variability, suggesting right atrial pressure of 3 mmHg.     Assessment and Plan:   1. DOE (dyspnea on exertion)   2. HTN (hypertension), benign   3. OSA on CPAP      1. DOE (dyspnea on exertion) Has some DOE which he relates to increased weight and sedentary lifestyle.   Last echocardiogram August 2021 demonstrated EF of 55 to 60%.  Mild LVH.  Indeterminate diastolic parameters.  DOE is likely related to morbid obesity, obesity hypoventilation syndrome, sedentary lifestyle, and deconditioning.  He states he is attempting to increase his walking activity for weight loss.  Patient states he has modified his diet some and removed red meats from his diet.  States he is walking more to help with weight loss.  2. HTN (hypertension), benign Blood pressure well controlled today at  130/80.  Continue Maxide 75/50 mg daily in AM.  Continue amlodipine 5 mg daily.  Patient states since he started taking the amlodipine his blood pressures have been much better..  3. OSA on CPAP History of OSA on CPAP in the past with mixed compliance.  He states he has received his new CPAP machine and is compliant with therapy.   Medication Adjustments/Labs and Tests Ordered: Current medicines are reviewed at length with the patient today.  Concerns regarding medicines are outlined above.   Disposition: Follow-up with Dr. Harl Bowie or APP in 6 months  Signed, Levell July, NP 11/28/2020 10:39 AM    [facility] [place], [place], [place] [address] Phone: [phone]; Fax: [phone]

## 2020-11-28 ENCOUNTER — Ambulatory Visit (INDEPENDENT_AMBULATORY_CARE_PROVIDER_SITE_OTHER): Payer: BC Managed Care – PPO | Admitting: Family Medicine

## 2020-11-28 ENCOUNTER — Encounter: Payer: Self-pay | Admitting: Family Medicine

## 2020-11-28 VITALS — BP 130/80 | HR 88 | Ht 67.0 in | Wt 325.4 lb

## 2020-11-28 DIAGNOSIS — I10 Essential (primary) hypertension: Secondary | ICD-10-CM | POA: Diagnosis not present

## 2020-11-28 DIAGNOSIS — Z9989 Dependence on other enabling machines and devices: Secondary | ICD-10-CM

## 2020-11-28 DIAGNOSIS — G4733 Obstructive sleep apnea (adult) (pediatric): Secondary | ICD-10-CM

## 2020-11-28 DIAGNOSIS — R0600 Dyspnea, unspecified: Secondary | ICD-10-CM

## 2020-11-28 DIAGNOSIS — R0609 Other forms of dyspnea: Secondary | ICD-10-CM

## 2020-11-28 NOTE — Patient Instructions (Addendum)

## 2020-12-21 DIAGNOSIS — G4733 Obstructive sleep apnea (adult) (pediatric): Secondary | ICD-10-CM | POA: Diagnosis not present

## 2021-01-13 ENCOUNTER — Telehealth: Payer: Self-pay | Admitting: Family Medicine

## 2021-01-13 NOTE — Telephone Encounter (Signed)
Wife dropped off forms for medical care certificate.  Please call her at [phone] or [place] at [phone]

## 2021-01-14 NOTE — Telephone Encounter (Signed)
Pt contacted and informed that he needed a follow up appt for form to be filled out since we had not seen him since Oct 2021. Pt had DOT physical a few days ago. Pt states he misplaced the form that was filled out last year and he is needing it by Friday to be swore in this weekend. Pt has scheduled physical for Sept 9. Please advise. Thank you

## 2021-01-16 NOTE — Telephone Encounter (Signed)
Please put in the current weight and height If he does not know his current weight use the last 1 from the last office visit Please keep follow-up in September as planned

## 2021-01-16 NOTE — Telephone Encounter (Signed)
Form completed and up front for pick up. Patient aware.

## 2021-01-16 NOTE — Telephone Encounter (Signed)
These help out- I have not seen this form?  Seems a little urgent?

## 2021-01-21 ENCOUNTER — Telehealth: Payer: Self-pay | Admitting: *Deleted

## 2021-01-21 DIAGNOSIS — G4733 Obstructive sleep apnea (adult) (pediatric): Secondary | ICD-10-CM | POA: Diagnosis not present

## 2021-01-21 NOTE — Telephone Encounter (Signed)
Pt has iniital cpap f/u tomorrow.  Tried to call pt but could not VM as full.  I did [facility] [employer] to tag Dr. Rexene Alberts to [facility].

## 2021-01-22 ENCOUNTER — Encounter: Payer: Self-pay | Admitting: Neurology

## 2021-01-22 ENCOUNTER — Telehealth: Payer: Self-pay | Admitting: Neurology

## 2021-01-22 ENCOUNTER — Ambulatory Visit: Payer: Self-pay | Admitting: Neurology

## 2021-01-22 NOTE — Telephone Encounter (Signed)
Patient called. He worked last night (does not normally work nights) and fell asleep and missed his appointment. He was very apologetic. He has been rescheduled for this coming Monday 8/29 @ 11:30 AM arrival 11:00 with [facility] NP. This is her first missed appointment. He will bring his CPAP machine and power cord to the appointment.

## 2021-01-22 NOTE — Telephone Encounter (Signed)
Pt no show appt due to overslept

## 2021-01-22 NOTE — Telephone Encounter (Signed)
I called patient [name] apologized profusely about missing his appointment working night shift.  [name] has an appointment scheduled Monday with Jinny Blossom I told him to bring his machine and his power cord when [name] comes [name] verbalized understanding we will see him then.

## 2021-01-26 ENCOUNTER — Ambulatory Visit: Payer: BC Managed Care – PPO | Admitting: Adult Health

## 2021-02-06 ENCOUNTER — Encounter: Payer: BC Managed Care – PPO | Admitting: Family Medicine

## 2021-02-21 DIAGNOSIS — G4733 Obstructive sleep apnea (adult) (pediatric): Secondary | ICD-10-CM | POA: Diagnosis not present

## 2021-02-27 DIAGNOSIS — I10 Essential (primary) hypertension: Secondary | ICD-10-CM | POA: Diagnosis not present

## 2021-02-27 DIAGNOSIS — Z125 Encounter for screening for malignant neoplasm of prostate: Secondary | ICD-10-CM | POA: Diagnosis not present

## 2021-02-27 DIAGNOSIS — E785 Hyperlipidemia, unspecified: Secondary | ICD-10-CM | POA: Diagnosis not present

## 2021-02-27 DIAGNOSIS — Z131 Encounter for screening for diabetes mellitus: Secondary | ICD-10-CM | POA: Diagnosis not present

## 2021-02-28 LAB — LIPID PANEL
Chol/HDL Ratio: 3.4 ratio (ref 0.0–5.0)
Cholesterol, Total: 170 mg/dL (ref 100–199)
HDL: 50 mg/dL (ref >39)
LDL Chol Calc (NIH): 102 mg/dL — ABNORMAL HIGH (ref 0–99)
Triglycerides: 97 mg/dL (ref 0–149)
VLDL Cholesterol Cal: 18 mg/dL (ref 5–40)

## 2021-02-28 LAB — COMPREHENSIVE METABOLIC PANEL
ALT: 16 IU/L (ref 0–44)
AST: 19 IU/L (ref 0–40)
Albumin/Globulin Ratio: 1.3 (ref 1.2–2.2)
Albumin: 4.2 g/dL (ref 3.8–4.9)
Alkaline Phosphatase: 63 IU/L (ref 44–121)
BUN/Creatinine Ratio: 14 (ref 9–20)
BUN: 16 mg/dL (ref 6–24)
Bilirubin Total: 0.4 mg/dL (ref 0.0–1.2)
CO2: 26 mmol/L (ref 20–29)
Calcium: 9.8 mg/dL (ref 8.7–10.2)
Chloride: 98 mmol/L (ref 96–106)
Creatinine, Ser: 1.14 mg/dL (ref 0.76–1.27)
Globulin, Total: 3.3 g/dL (ref 1.5–4.5)
Glucose: 94 mg/dL (ref 70–99)
Potassium: 4.1 mmol/L (ref 3.5–5.2)
Sodium: 141 mmol/L (ref 134–144)
Total Protein: 7.5 g/dL (ref 6.0–8.5)
eGFR: 76 mL/min/{1.73_m2} (ref >59)

## 2021-02-28 LAB — PSA: Prostate Specific Ag, Serum: 0.8 ng/mL (ref 0.0–4.0)

## 2021-03-02 ENCOUNTER — Other Ambulatory Visit: Payer: Self-pay

## 2021-03-02 ENCOUNTER — Ambulatory Visit (INDEPENDENT_AMBULATORY_CARE_PROVIDER_SITE_OTHER): Payer: BC Managed Care – PPO | Admitting: Family Medicine

## 2021-03-02 ENCOUNTER — Encounter: Payer: Self-pay | Admitting: Family Medicine

## 2021-03-02 VITALS — BP 138/88 | HR 76 | Temp 97.3°F | Ht 67.0 in | Wt 332.0 lb

## 2021-03-02 DIAGNOSIS — E785 Hyperlipidemia, unspecified: Secondary | ICD-10-CM | POA: Diagnosis not present

## 2021-03-02 DIAGNOSIS — Z0001 Encounter for general adult medical examination with abnormal findings: Secondary | ICD-10-CM

## 2021-03-02 DIAGNOSIS — Z23 Encounter for immunization: Secondary | ICD-10-CM

## 2021-03-02 DIAGNOSIS — G4733 Obstructive sleep apnea (adult) (pediatric): Secondary | ICD-10-CM

## 2021-03-02 DIAGNOSIS — I10 Essential (primary) hypertension: Secondary | ICD-10-CM | POA: Diagnosis not present

## 2021-03-02 DIAGNOSIS — Z0000 Encounter for general adult medical examination without abnormal findings: Secondary | ICD-10-CM

## 2021-03-02 DIAGNOSIS — G4730 Sleep apnea, unspecified: Secondary | ICD-10-CM | POA: Insufficient documentation

## 2021-03-02 DIAGNOSIS — K429 Umbilical hernia without obstruction or gangrene: Secondary | ICD-10-CM

## 2021-03-02 NOTE — Progress Notes (Addendum)
Subjective:    Patient ID: [name], male    DOB: [birth year], 55 y.o.   MRN: [id number]  HPI Annual exam The patient comes in today for a wellness visit. Very nice patient Struggles with his weight We did discuss medications versus surgery   The patient's BMI is calculated.  The patient does have obesity.  The patient does try to some degree staying active and watching diet.  It is in the vital signs and acknowledged.  It is above the recommended BMI for the patient's height and weight.  The patient has been counseled regarding healthy diet, restricted portions, avoiding excessive carbohydrates/sugary foods, and increase physical activity as health permits.  It is in the patient's best interest to lower the risk of secondary illness including heart disease strokes and cancer by losing weight.  The patient acknowledges this information.  Has severe umbilical hernia not obstructed.  No vomiting.  Does give him intermittent pain  A review of their health history was completed.  A review of medications was also completed.  Any needed refills; none  Eating habits: fair  Falls/  MVA accidents in past few months: no  Regular exercise: some  Specialist pt sees on regular basis: no  Preventative health issues were discussed.   Additional concerns: abd hernia pain, weight loss concern  Labs reviewed Results for orders placed or performed in visit on 11/20/20  Novel Coronavirus, NAA ([facility])   Specimen: Nasopharyngeal(NP) swabs in vial transport medium   Nasopharynge  Result Value Ref Range   SARS-CoV-2, NAA Not Detected Not Detected  SARS-COV-2, NAA 2 DAY TAT   Nasopharynge  Result Value Ref Range   SARS-CoV-2, NAA 2 DAY TAT Performed   Comprehensive metabolic panel  Result Value Ref Range   Glucose 94 70 - 99 mg/dL   BUN 16 6 - 24 mg/dL   Creatinine, Ser 1.14 0.76 - 1.27 mg/dL   eGFR 76 >59 mL/min/1.73   BUN/Creatinine Ratio 14 9 - 20   Sodium 141 134 - 144 mmol/L    Potassium 4.1 3.5 - 5.2 mmol/L   Chloride 98 96 - 106 mmol/L   CO2 26 20 - 29 mmol/L   Calcium 9.8 8.7 - 10.2 mg/dL   Total Protein 7.5 6.0 - 8.5 g/dL   Albumin 4.2 3.8 - 4.9 g/dL   Globulin, Total 3.3 1.5 - 4.5 g/dL   Albumin/Globulin Ratio 1.3 1.2 - 2.2   Bilirubin Total 0.4 0.0 - 1.2 mg/dL   Alkaline Phosphatase 63 44 - 121 IU/L   AST 19 0 - 40 IU/L   ALT 16 0 - 44 IU/L  Lipid panel  Result Value Ref Range   Cholesterol, Total 170 100 - 199 mg/dL   Triglycerides 97 0 - 149 mg/dL   HDL 50 >39 mg/dL   VLDL Cholesterol Cal 18 5 - 40 mg/dL   LDL Chol Calc (NIH) 102 (H) 0 - 99 mg/dL   Chol/HDL Ratio 3.4 0.0 - 5.0 ratio  PSA  Result Value Ref Range   Prostate Specific Ag, Serum 0.8 0.0 - 4.0 ng/mL    Labs reviewed in detail Review of Systems     Objective:   Physical Exam General-in no acute distress Eyes-no discharge Lungs-respiratory rate normal, CTA CV-no murmurs,RRR Extremities skin warm dry no edema Neuro grossly normal Behavior normal, alert  Morbid obesity noted. Umbilical hernia noted. Prostate exam normal, difficult to feel the full prostate because of his size     Assessment &  Plan:  1. Well adult exam Adult wellness-complete.wellness physical was conducted today. Importance of diet and exercise were discussed in detail.  In addition to this a discussion regarding safety was also covered. We also reviewed over immunizations and gave recommendations regarding current immunization needed for age.  In addition to this additional areas were also touched on including: Preventative health exams needed:  Colonoscopy 2031  Patient was advised yearly wellness exam  - Flu Vaccine QUAD 16moIM (Fluarix, Fluzone & Alfiuria Quad PF)  2. HTN (hypertension), benign Blood pressure decent control.  Watch diet stay active watch portions fit in some walking.  3. Morbid obesity ([place] Portion control, healthy eating, follow-up if progressive troubles Patient was  encouraged to connect with his insurance company to see if surgery and or medications are covered I would not recommend phenteramine currently because of his blood pressure issues  4. Hyperlipidemia, unspecified hyperlipidemia type Healthy diet regular activity recommended  5. Obstructive sleep apnea syndrome Continue CPAP  6. Umbilical hernia without obstruction and without gangrene Referral to general surgery warnings discussed - Ambulatory referral to General Surgery  It should be noted that he has morbid obesity In my opinion it would be beneficial for the patient to consider bariatric surgery We will give him referral if he is on board with this recommendation Certainly this is not mandatory it will be of his choosing

## 2021-03-02 NOTE — Patient Instructions (Addendum)
Call your insurance  See if weight loss surgery is covered  - gastric bypass better option  - gastric sleeve is an option  Other options If surgery is not covered see if these meds are covered  - medication such as   - wegovy    - saxenda  Please let us know  We will help you get an appointment with general surgery in [place] They will call you to set up the appointment  Let us know if any problems  Thanks- Dr Nicki Reaper

## 2021-03-03 ENCOUNTER — Encounter: Payer: Self-pay | Admitting: Adult Health

## 2021-03-03 ENCOUNTER — Ambulatory Visit: Payer: BC Managed Care – PPO | Admitting: Adult Health

## 2021-03-03 VITALS — BP 140/87 | HR 87 | Ht 67.0 in | Wt 332.0 lb

## 2021-03-03 DIAGNOSIS — Z9989 Dependence on other enabling machines and devices: Secondary | ICD-10-CM | POA: Diagnosis not present

## 2021-03-03 DIAGNOSIS — G4733 Obstructive sleep apnea (adult) (pediatric): Secondary | ICD-10-CM | POA: Diagnosis not present

## 2021-03-03 NOTE — Patient Instructions (Signed)

## 2021-03-03 NOTE — Progress Notes (Signed)
PATIENT: [name] DOB: September 14, 1965  REASON FOR VISIT: follow up HISTORY FROM: patient  HISTORY OF PRESENT ILLNESS: Today 03/03/21  [name] is a 55 year old male with a history of obstructive sleep apnea on CPAP.  He returns today for follow-up.  He states that he has been trying to use his CPAP.  He states that he did get fitted for a different mask but was told by the technician that the headgear was too small but that is all they had.  He states that he has been trying to use it but it does not fit correctly.  His download is below.    REVIEW OF SYSTEMS: Out of a complete 14 system review of symptoms, the patient complains only of the following symptoms, and all other reviewed systems are negative.  FSS 15 ESS 4  ALLERGIES: No Known Allergies  HOME MEDICATIONS: Outpatient Medications Prior to Visit  Medication Sig Dispense Refill   triamterene-hydrochlorothiazide (MAXZIDE) 75-50 MG tablet Take 1 tablet by mouth daily. 90 tablet 3   amLODipine (NORVASC) 10 MG tablet Take 1 tablet (10 mg total) by mouth daily. (Patient not taking: No sig reported) 30 tablet 6   No facility-administered medications prior to visit.    PAST MEDICAL HISTORY: Past Medical History:  Diagnosis Date   Cancer [facility])    kidney   Hypertension    MI (myocardial infarction) ([place])    Renal disorder    partial R nephrectomy-cancer   Sleep apnea     PAST SURGICAL HISTORY: Past Surgical History:  Procedure Laterality Date   COLONOSCOPY WITH PROPOFOL N/A 12/06/2019   Procedure: COLONOSCOPY WITH PROPOFOL;  Surgeon: Daneil Dolin, MD;  Location[facility];  Service: Endoscopy;  Laterality: N/A;  1:15pm   ESOPHAGOGASTRODUODENOSCOPY     ruptured ulcer".   KIDNEY SURGERY Right    Partial nephrectomy d/t kidney cancer   POLYPECTOMY  12/06/2019   Procedure: POLYPECTOMY;  Surgeon: Daneil Dolin, MD;  Location: [facility];  Service: Endoscopy;;    FAMILY HISTORY: Family History   Problem Relation Age of Onset   Colon polyps Father    Cancer Father    Lupus Mother    Colon cancer Neg Hx     SOCIAL HISTORY: Social History   Socioeconomic History   Marital status: Married    Spouse name: Not on file   Number of children: 3   Years of education: Not on file   Highest education level: Not on file  Occupational History   Not on file  Tobacco Use   Smoking status: Former    Packs/day: 0.50    Years: 7.00    Pack years: 3.50    Types: Cigarettes    Quit date: 12/03/2004    Years since quitting: 16.2   Smokeless tobacco: Never  Vaping Use   Vaping Use: Never used  Substance and Sexual Activity   Alcohol use: Yes    Comment: occas wine   Drug use: No   Sexual activity: Yes  Other Topics Concern   Not on file  Social History Narrative   Lives at home with wife   Right handed   Caffeine: cup of coffee rarely    Social Determinants of Radio broadcast assistant Strain: Not on file  Food Insecurity: Not on file  Transportation Needs: Not on file  Physical Activity: Not on file  Stress: Not on file  Social Connections: Not on file  Intimate Partner Violence:  Not on file      PHYSICAL EXAM  Vitals:   03/03/21 1333  BP: 140/87  Pulse: 87  Weight: (!) 332 lb (150.6 kg)  Height: 5\' 7"  (1.702 m)   Body mass index is 52 kg/m.  Generalized: Well developed, in no acute distress  Chest: Lungs clear to auscultation bilaterally  Neurological examination  Mentation: Alert oriented to time, place, history taking. Follows all commands speech and language fluent Cranial nerve II-XII: Extraocular movements were full, visual field were full on confrontational test Head turning and shoulder shrug  were normal and symmetric. Motor: The motor testing reveals 5 over 5 strength of all 4 extremities. Good symmetric motor tone is noted throughout.  Sensory: Sensory testing is intact to soft touch on all 4 extremities. No evidence of extinction is noted.   Gait and station: Gait is normal.    DIAGNOSTIC DATA (LABS, IMAGING, TESTING) - I reviewed patient records, labs, notes, testing and imaging myself where available.  Lab Results  Component Value Date   WBC 9.3 08/02/2020   HGB 15.3 08/02/2020   HCT 46.2 08/02/2020   MCV 83.4 08/02/2020   PLT 214 08/02/2020      Component Value Date/Time   NA 141 02/27/2021 0935   K 4.1 02/27/2021 0935   CL 98 02/27/2021 0935   CO2 26 02/27/2021 0935   GLUCOSE 94 02/27/2021 0935   GLUCOSE 121 (H) 08/02/2020 0143   BUN 16 02/27/2021 0935   CREATININE 1.14 02/27/2021 0935   CREATININE 1.04 01/28/2014 1211   CALCIUM 9.8 02/27/2021 0935   PROT 7.5 02/27/2021 0935   ALBUMIN 4.2 02/27/2021 0935   AST 19 02/27/2021 0935   ALT 16 02/27/2021 0935   ALKPHOS 63 02/27/2021 0935   BILITOT 0.4 02/27/2021 0935   GFRNONAA >60 08/02/2020 0143   GFRAA 91 01/28/2020 1144   Lab Results  Component Value Date   CHOL 170 02/27/2021   HDL 50 02/27/2021   LDLCALC 102 (H) 02/27/2021   TRIG 97 02/27/2021   CHOLHDL 3.4 02/27/2021       ASSESSMENT AND PLAN 55 y.o. year old male  has a past medical history of Cancer ([place]), Hypertension, MI (myocardial infarction) ([place]), Renal disorder, and Sleep apnea. here with:  OSA on CPAP  - CPAP compliance excellent - Good treatment of AHI  - Encourage patient to use CPAP nightly and > 4 hours each night -Order placed for new headgear also sent a message to the manager at [facility] - F/U in 1 year or sooner if needed    Craig Givens, MSN, NP-C 03/03/2021, 2:05 PM [facility] [address], [place], [place] [address] [phone]

## 2021-03-05 ENCOUNTER — Telehealth: Payer: Self-pay | Admitting: Family Medicine

## 2021-03-05 ENCOUNTER — Telehealth: Payer: Self-pay | Admitting: *Deleted

## 2021-03-05 NOTE — Telephone Encounter (Signed)
Form in provider office for review. Please advise. Thank you 

## 2021-03-05 NOTE — Telephone Encounter (Signed)
03/03/21 referral sent to [facility]; will call pt back when form has been dropped off.

## 2021-03-05 NOTE — Telephone Encounter (Signed)
Emailed [facility] for new orders for Patient

## 2021-03-05 NOTE — Telephone Encounter (Signed)
Patient called in stating that Dr. Nicki Reaper wanted him to check with his insurance company to see if they would cover weight loss surgery. He has contacted them and they are needing codes so they can see if they cover it. He states his wife is bringing over the paper to have filled out for his insurance company.  He is also inquiring about the referral to somewhere in [place] for a hernia.  CB# [phone]

## 2021-03-05 NOTE — Telephone Encounter (Signed)
Forms dropped off by wife. I will place in Dr. Lance Sell folder.

## 2021-03-08 NOTE — Telephone Encounter (Signed)
Nurses It does appear that bariatric surgery would be covered If they would like referral to [facility] surgery for this that would be fine Also it would be beneficial for getting this covered if he can write down what attempts he has made that losing weight and send that documentation to [place] so we can incorporate this into his medical record It does not have to be extremely detailed just more list out what he has tried in the past to lose weight Such as diet, activity, food restriction, any medications?  Also write down whether or not it was successful? Once he is written this narrative out he should send it to [place]

## 2021-03-09 NOTE — Telephone Encounter (Signed)
Patient states he would prefer to discuss weight loss medications over surgery at this time. Patient scheduled office visit 03/12/21 at 8:20 to discuss weight loss medications.

## 2021-03-09 NOTE — Telephone Encounter (Signed)
Patient states he is nervous about pursuing the surgery but trusts Dr Nicki Reaper if he thinks this is something he needs to do. Patient states he lost a lot of weight with a weight loss med in 2016 and also didn't know id something like that could help again- states he lost over 60 lbs  Patient states he will write everything down and bring it by here for Dr Nicki Reaper

## 2021-03-09 NOTE — Telephone Encounter (Signed)
So in the past we used a medication called phenteramine That particular medication is a appetite suppressant that can help a person lose some weight But it can also cause blood pressure to go up On his most recent visit his blood pressure was borderline and I would not recommend this medication given that circumstance  There are weight loss medications known as LWHKNZ and Saxenda They can help [name] person lose some weight These are injectable medications, Mancel Parsons is once a week, if he is interested in trying these medications he would need to see if his insurance company covers it As for the surgery, it would typically be the procedure that would lead to the best weight loss over a long span of time Doing a consultation with the surgeon does not commit him to doing the surgery its just to help him get more information  Seems to me that it would be advisable for [name] to consider doing a sitdown office visit with myself that is fully devoted to talking about weight loss and the options he has.  What he does is purely his choice not something that we are trying to force him into doing

## 2021-03-12 ENCOUNTER — Ambulatory Visit (INDEPENDENT_AMBULATORY_CARE_PROVIDER_SITE_OTHER): Payer: BC Managed Care – PPO | Admitting: Family Medicine

## 2021-03-12 ENCOUNTER — Other Ambulatory Visit: Payer: Self-pay

## 2021-03-12 ENCOUNTER — Encounter: Payer: Self-pay | Admitting: Family Medicine

## 2021-03-12 DIAGNOSIS — R739 Hyperglycemia, unspecified: Secondary | ICD-10-CM

## 2021-03-12 NOTE — Progress Notes (Signed)
i

## 2021-03-12 NOTE — Progress Notes (Signed)
   Subjective:    Patient ID: [name], male    DOB: 03/06/1966, 55 y.o.   MRN: [id number]  HPI Pt here to discuss weight loss med. Pt states insurance will cover surgery but pt is open to other alternatives. Pt states sister in law lost a lot of weight on the shots. Pt states he has tried weight loss pills in the past.  Long discussion held today with the patient.  He is tried numerous times to lose weight and has had very difficult time.  He is trying to watch his calories.  He is trying to stay more physically active.  At one time he tried phenteramine.  Phenteramine did allow him to lose approximately 50 pounds but then over time he put most of it back on.  He vacillates between a slightly improved weight and a morbidly obesity weight.  Unfortunately he has not had any permanent weight loss  We have had a discussion regarding weight loss surgery he is interested in finding out more but he is hesitant to go through with surgery  We did talk about medications that can help with weight loss and discussed several different medicines including GLP-1 medicines.  Review of Systems     Objective:   Physical Exam  Lungs are clear heart regular blood pressure reasonable      Assessment & Plan:  Morbid obesity He would benefit from surgery for weight reduction but this is a big step for him.  I recommend that he get more information and think about it before committing In addition to this he is interested in trying a GLP-1 we did discuss side effects we gave him the name of 3 particular medications Williams Che Saxenda  He will look into see if these are covered by his insurance and he will let us know  Lab work ordered await the results  Keep all regular follow-up visits

## 2021-03-12 NOTE — Patient Instructions (Signed)
Wegovy Saxenda 

## 2021-03-13 LAB — HEMOGLOBIN A1C
Est. average glucose Bld gHb Est-mCnc: 117 mg/dL
Hgb A1c MFr Bld: 5.7 % — ABNORMAL HIGH (ref 4.8–5.6)

## 2021-03-13 LAB — TSH: TSH: 1.32 u[IU]/mL (ref 0.450–4.500)

## 2021-03-23 DIAGNOSIS — G4733 Obstructive sleep apnea (adult) (pediatric): Secondary | ICD-10-CM | POA: Diagnosis not present

## 2021-03-26 ENCOUNTER — Encounter: Payer: Self-pay | Admitting: Family Medicine

## 2021-03-31 NOTE — Telephone Encounter (Signed)
Nurses May order Mounjaro 2.5 mg once weekly subcutaneous for the first 4 weeks  If he tolerates this then the next step is 5 mg once weekly For the next 4 weeks.  At that point if tolerating we can go up again to 7.5 mg once weekly It would be wise for Nicholas to send [place] an update every 3 to 4 weeks how he feels the medication is doing  We will be important for the patient to follow-up in 3 months  Based upon how well he tolerates this we will continue to increase the dose until we reached the maximum tolerated dose  Side effects not unusual to have some nausea the first few days with this medication but if severe nausea and vomiting or severe abdominal pain stop medicine and notify us.  Healthy diet regular physical activity recommended

## 2021-04-01 MED ORDER — TIRZEPATIDE 2.5 MG/0.5ML ~~LOC~~ SOAJ
SUBCUTANEOUS | 0 refills | Status: DC
Start: 2021-04-01 — End: 2021-04-30

## 2021-04-01 NOTE — Addendum Note (Signed)
Addended by: Vicente Males on: 04/01/2021 08:38 AM   Modules accepted: Orders

## 2021-04-09 NOTE — Telephone Encounter (Signed)
I am glad that he is tolerating the current dosing  Nurses-please make sure that he does this current dosing once weekly for the first 4 weeks  So that would be 1 full month of that dosing then the next step would be the 5 mg  When he gets to the third dosing-in other words the third week of the 2.5 then he should let us know so that we can bump him up to the 5 mg for the next 4 weeks  If there is any confusion he is to let us know

## 2021-04-23 DIAGNOSIS — G4733 Obstructive sleep apnea (adult) (pediatric): Secondary | ICD-10-CM | POA: Diagnosis not present

## 2021-04-29 NOTE — Telephone Encounter (Signed)
Nurses Next step Mounjaro 5 mg subcutaneous once weekly may send in a full month When the patient is finishing up his fourth week we can reassess how he is doing and bump up the dose if he is tolerating it well  This medication does require close following I would recommend an office visit in January thank you  [name] is doing well with the medicine so far if any problems notify us

## 2021-04-30 MED ORDER — TIRZEPATIDE 5 MG/0.5ML ~~LOC~~ SOAJ: SUBCUTANEOUS | 0 refills | Status: DC

## 2021-04-30 NOTE — Addendum Note (Signed)
Addended by: Vicente Males on: 04/30/2021 08:53 AM   Modules accepted: Orders

## 2021-05-23 DIAGNOSIS — G4733 Obstructive sleep apnea (adult) (pediatric): Secondary | ICD-10-CM | POA: Diagnosis not present

## 2021-05-28 ENCOUNTER — Telehealth: Payer: Self-pay | Admitting: Family Medicine

## 2021-05-28 NOTE — Telephone Encounter (Signed)
Patient is requesting refill on tirzepatide  5mg  he is completely out called into [facility]

## 2021-05-28 NOTE — Telephone Encounter (Signed)
Please advise. Thank you

## 2021-05-29 MED ORDER — TIRZEPATIDE 7.5 MG/0.5ML ~~LOC~~ SOAJ: 7.5000 mg | SUBCUTANEOUS | 0 refills | Status: DC

## 2021-05-29 NOTE — Telephone Encounter (Signed)
If he tolerated the 5 mg then the next dose is 7.5 mg once weekly may send them 1 month supply with 1 refill patient will need to do a follow-up visit in February in 1 month if he is tolerating this dose well he is to let us know we will increase the dosing

## 2021-05-29 NOTE — Telephone Encounter (Signed)
Patient notified and scheduled follow up office visit the end of January before his next script will be due.

## 2021-05-29 NOTE — Telephone Encounter (Signed)
Please see telephone message Needs to go ahead and go up to 7.5 mg once weekly Instructions are within the telephone message

## 2021-05-29 NOTE — Addendum Note (Signed)
Addended by: Dairl Ponder on: 05/29/2021 10:09 AM   Modules accepted: Orders

## 2021-06-02 ENCOUNTER — Ambulatory Visit: Payer: BC Managed Care – PPO | Admitting: Family Medicine

## 2021-06-19 ENCOUNTER — Ambulatory Visit (INDEPENDENT_AMBULATORY_CARE_PROVIDER_SITE_OTHER): Payer: BC Managed Care – PPO | Admitting: Cardiology

## 2021-06-19 ENCOUNTER — Encounter: Payer: Self-pay | Admitting: Cardiology

## 2021-06-19 ENCOUNTER — Other Ambulatory Visit: Payer: Self-pay

## 2021-06-19 VITALS — BP 146/98 | HR 86 | Ht 67.0 in | Wt 305.0 lb

## 2021-06-19 DIAGNOSIS — R0609 Other forms of dyspnea: Secondary | ICD-10-CM

## 2021-06-19 DIAGNOSIS — I10 Essential (primary) hypertension: Secondary | ICD-10-CM

## 2021-06-19 NOTE — Patient Instructions (Signed)
Medication Instructions:  Your physician recommends that you continue on your current medications as directed. Please refer to the Current Medication list given to you today.     Call us Monday, 06/22/21 with blood pressure    Follow-Up: 1 year    If you need a refill on your cardiac medications before your next appointment, please call your pharmacy.

## 2021-06-19 NOTE — Progress Notes (Signed)
Clinical Summary [name] is a 56 y.o.maleseen today for follow up of the following medical problems.       1. Abnormal EKG - noted by pcp EKG, SR with inferior and lateral precordial Twave inversions.    2. DOE - recent sedentary lifestyle  - DOE with 1-2 flights per stairs - no chest pains - some occasional LE edema that is chronic     12/2019 echo LVEF 55-60%,  indet DDx -SOB previously improved with weight loss, reoccurred with prior weight gain    - recent weight loss 30 lbs since Jan 2023. Has started tirzepatide by pcp for weight loss - breathing has improved with weight loss.  - no chest pains, no LE edema.      3. OSA - followed by Dr Rexene Alberts - recent home sleep study showed severe OSA - mixed compliance with cpap       4. HTN - has not taken med yet today - checks at home but does not recall numbers     SH: son recently passed. Was in his 60s on dialysis, passed away suddenyl few days ago   Moderna covid vaccine x 3.      He is from [place], [place].     Past Medical History:  Diagnosis Date   Cancer ([place])    kidney   Hypertension    MI (myocardial infarction) ([place])    Renal disorder    partial R nephrectomy-cancer   Sleep apnea      No Known Allergies   Current Outpatient Medications  Medication Sig Dispense Refill   tirzepatide (MOUNJARO) 7.5 MG/0.5ML Pen Inject 7.5 mg into the skin once a week. 6 mL 0   triamterene-hydrochlorothiazide (MAXZIDE) 75-50 MG tablet Take 1 tablet by mouth daily. 90 tablet 3   No current facility-administered medications for this visit.     Past Surgical History:  Procedure Laterality Date   COLONOSCOPY WITH PROPOFOL N/A 12/06/2019   Procedure: COLONOSCOPY WITH PROPOFOL;  Surgeon: Daneil Dolin, MD;  [facility];  Service: Endoscopy;  Laterality: N/A;  1:15pm   ESOPHAGOGASTRODUODENOSCOPY     ruptured ulcer".   KIDNEY SURGERY Right    Partial nephrectomy d/t kidney cancer   POLYPECTOMY   12/06/2019   Procedure: POLYPECTOMY;  Surgeon: Daneil Dolin, MD;  Location: [facility];  Service: Endoscopy;;     No Known Allergies    Family History  Problem Relation Age of Onset   Colon polyps Father    Cancer Father    Lupus Mother    Colon cancer Neg Hx      Social History [name] reports that he quit smoking about 16 years ago. His smoking use included cigarettes. He has a 3.50 pack-year smoking history. He has never used smokeless tobacco. [name] reports current alcohol use.   Review of Systems CONSTITUTIONAL: No weight loss, fever, chills, weakness or fatigue.  HEENT: Eyes: No visual loss, blurred vision, double vision or yellow sclerae.No hearing loss, sneezing, congestion, runny nose or sore throat.  SKIN: No rash or itching.  CARDIOVASCULAR: per hpi RESPIRATORY: No shortness of breath, cough or sputum.  GASTROINTESTINAL: No anorexia, nausea, vomiting or diarrhea. No abdominal pain or blood.  GENITOURINARY: No burning on urination, no polyuria NEUROLOGICAL: No headache, dizziness, syncope, paralysis, ataxia, numbness or tingling in the extremities. No change in bowel or bladder control.  MUSCULOSKELETAL: No muscle, back pain, joint pain or stiffness.  LYMPHATICS: No enlarged nodes.  No history of splenectomy.  PSYCHIATRIC: No history of depression or anxiety.  ENDOCRINOLOGIC: No reports of sweating, cold or heat intolerance. No polyuria or polydipsia.  Marland Kitchen   Physical Examination Today's Vitals   06/19/21 0836  BP: (!) 146/98  Pulse: 86  SpO2: 96%  Weight: (!) 305 lb (138.3 kg)  Height: 5\' 7"  (1.702 m)   Body mass index is 47.77 kg/m.  Gen: resting comfortably, no acute distress HEENT: no scleral icterus, pupils equal round and reactive, no palptable cervical adenopathy,  CV: RRR, no m/rg no jvd Resp: Clear to auscultation bilaterally GI: abdomen is soft, non-tender, non-distended, normal bowel sounds, no hepatosplenomegaly MSK: extremities  are warm, no edema.  Skin: warm, no rash Neuro:  no focal deficits Psych: appropriate affect   Diagnostic Studies  12/2019 echo IMPRESSIONS     1. Left ventricular ejection fraction, by estimation, is 55 to 60%. The  left ventricle has normal function. Left ventricular endocardial border  not optimally defined to evaluate regional wall motion. There is mild left  ventricular hypertrophy. Left  ventricular diastolic parameters are indeterminate.   2. Right ventricular systolic function is normal. The right ventricular  size is normal.   3. The mitral valve is normal in structure. No evidence of mitral valve  regurgitation. No evidence of mitral stenosis.   4. The aortic valve has an indeterminant number of cusps. Aortic valve  regurgitation is not visualized. No aortic stenosis is present.   5. The inferior vena cava is normal in size with greater than 50%  respiratory variability, suggesting right atrial pressure of 3 mmHg.    Assessment and Plan  1. DOE - symptoms have improved with weight loss, overall benign echo. I think his nonspecific EKG changes are secondary to mild LVH.  - recent 30 lbs weight loss with further improvement of SOB/DOE. No plans for further workup at this time.    2. HTN - has not taken meds yet today, he will call us with home bp's early next week - if above 130/80 on average would add norvasc 5mg  daily.    F/u 1 year   Arnoldo Lenis, M.D.

## 2021-06-23 DIAGNOSIS — G4733 Obstructive sleep apnea (adult) (pediatric): Secondary | ICD-10-CM | POA: Diagnosis not present

## 2021-06-25 ENCOUNTER — Encounter: Payer: Self-pay | Admitting: Family Medicine

## 2021-06-25 ENCOUNTER — Ambulatory Visit: Payer: BC Managed Care – PPO | Admitting: Family Medicine

## 2021-06-25 MED ORDER — TIRZEPATIDE 10 MG/0.5ML ~~LOC~~ SOAJ: SUBCUTANEOUS | 1 refills | Status: DC

## 2021-06-25 NOTE — Telephone Encounter (Signed)
Nurses If he is tolerating the 7.5 and wants to go up on the dose to the 10 mg that would be fine once weekly for the next month with 1 additional refill then follow-up somewhere in approximately 3 to 6 weeks  If he desires to stick with the 7.5 it would be fine to send in 1 month with 1 additional refills and follow-up somewhere within the next 3 to 6 weeks  Please communicate with patient then move forward with the above thank you

## 2021-06-25 NOTE — Addendum Note (Signed)
Addended by: Vicente Males on: 06/25/2021 11:36 AM   Modules accepted: Orders

## 2021-06-26 ENCOUNTER — Other Ambulatory Visit: Payer: Self-pay

## 2021-06-26 ENCOUNTER — Encounter: Payer: Self-pay | Admitting: Nurse Practitioner

## 2021-06-26 ENCOUNTER — Ambulatory Visit (INDEPENDENT_AMBULATORY_CARE_PROVIDER_SITE_OTHER): Payer: BC Managed Care – PPO | Admitting: Nurse Practitioner

## 2021-06-26 VITALS — Temp 97.6°F | Wt 301.0 lb

## 2021-06-26 DIAGNOSIS — J069 Acute upper respiratory infection, unspecified: Secondary | ICD-10-CM

## 2021-06-26 DIAGNOSIS — B9689 Other specified bacterial agents as the cause of diseases classified elsewhere: Secondary | ICD-10-CM | POA: Diagnosis not present

## 2021-06-26 MED ORDER — AZITHROMYCIN 250 MG PO TABS: ORAL_TABLET | ORAL | 0 refills | Status: DC

## 2021-06-26 NOTE — Progress Notes (Signed)
° °  Subjective:    Patient ID: [name], male    DOB: 1965/07/11, 56 y.o.   MRN: [id number]  HPI  Patient with cough for one week. Patient states he took Mucinex and Theraflu last night which made it "85-90%" better. Covid test negative and leaving on cruise in the morning. Cough is still the main symptom. Occasionally productive. No fever, ear pain or sore throat. No sinus headache. No CP or SOB. No N/V. Leaving tomorrow morning to go on a cruise.     Objective:   Physical Exam NAD. Alert, oriented. TMs mildly retracted more on the right. No erythema. Nasal mucosa: clear; mild erythema at the septum bilaterally. Pharynx injected with PND noted. Neck supple with mild anterior adenopathy. Lungs clear. Heart RRR. Today's Vitals   06/26/21 1625  Temp: 97.6 F (36.4 C)  TempSrc: Oral  Weight: (!) 301 lb (136.5 kg)   Body mass index is 47.14 kg/m.        Assessment & Plan:  Bacterial upper respiratory infection Meds ordered this encounter  Medications   azithromycin (ZITHROMAX Z-PAK) 250 MG tablet    Sig: Take 2 tablets (500 mg) on  Day 1,  followed by 1 tablet (250 mg) once daily on Days 2 through 5.    Dispense:  6 each    Refill:  0    Order Specific Question:   Supervising Provider    Answer:   Kathyrn Drown [phone]   Rx for Zpack sent in to pharmacy for patient to take on his trip. Reviewed warning signs. Start antibiotic if needed. Seek care if worse. Start steroid nasal spray. Given samples of Mucinex DM . Return if symptoms worsen or fail to improve.

## 2021-06-26 NOTE — Patient Instructions (Signed)
Flonase or Nasacort AQ as directed

## 2021-07-20 ENCOUNTER — Telehealth: Payer: Self-pay

## 2021-07-20 MED ORDER — TIRZEPATIDE 10 MG/0.5ML ~~LOC~~ SOAJ: SUBCUTANEOUS | 0 refills | Status: DC

## 2021-07-20 NOTE — Telephone Encounter (Signed)
1 month refill

## 2021-07-20 NOTE — Telephone Encounter (Signed)
Pt called in requesting a refill of tirzepatide [facility]) 10 MG/0.5ML. Pt states that he has taken his last shot. Pt also has an appt scheduled for 2/22 with pcp. Please advise.  Cb#: [phone]

## 2021-07-20 NOTE — Telephone Encounter (Signed)
Prescription sent electronically to pharmacy. Patient notified. 

## 2021-07-22 ENCOUNTER — Ambulatory Visit: Payer: BC Managed Care – PPO | Admitting: Family Medicine

## 2021-07-24 DIAGNOSIS — G4733 Obstructive sleep apnea (adult) (pediatric): Secondary | ICD-10-CM | POA: Diagnosis not present

## 2021-08-03 ENCOUNTER — Ambulatory Visit (INDEPENDENT_AMBULATORY_CARE_PROVIDER_SITE_OTHER): Payer: BC Managed Care – PPO | Admitting: Family Medicine

## 2021-08-03 ENCOUNTER — Other Ambulatory Visit: Payer: Self-pay

## 2021-08-03 VITALS — BP 130/78 | HR 82 | Temp 97.2°F | Ht 67.0 in | Wt 289.8 lb

## 2021-08-03 DIAGNOSIS — I10 Essential (primary) hypertension: Secondary | ICD-10-CM

## 2021-08-03 DIAGNOSIS — E785 Hyperlipidemia, unspecified: Secondary | ICD-10-CM

## 2021-08-03 DIAGNOSIS — R7309 Other abnormal glucose: Secondary | ICD-10-CM

## 2021-08-03 MED ORDER — TIRZEPATIDE 12.5 MG/0.5ML ~~LOC~~ SOAJ: SUBCUTANEOUS | 1 refills | Status: DC

## 2021-08-03 MED ORDER — TRIAMTERENE-HCTZ 75-50 MG PO TABS: 1.0000 | ORAL_TABLET | Freq: Every day | ORAL | 3 refills | Status: DC

## 2021-08-03 NOTE — Progress Notes (Signed)
? ?  Subjective:  ? ? Patient ID: [name], male    DOB: 27-Mar-1966, 56 y.o.   MRN: [id number] ? ?HPI ? ?Patient here for follow up on weight ?Very nice patient ?Working hard to try and lose weight ?Taking Mounjaro ?Doing very well with this ?Doing an excellent job with dietary measures ?Fitting in walking ?Patient is prediabetic morbidly obese and has other risk factors including hyperlipidemia ? ?Review of Systems ? ?   ?Objective:  ? Physical Exam ?General-in no acute distress ?Eyes-no discharge ?Lungs-respiratory rate normal, CTA ?CV-no murmurs,RRR ?Extremities skin warm dry no edema ?Neuro grossly normal ?Behavior normal, alert ? ? ? ? ?   ?Assessment & Plan:  ?Significant weight loss ?Continue Mounjaro bump up the dose ?Check A1c lipid profile ?Continue healthy diet ?Follow-up again within 4 to 5 months ? ?

## 2021-08-11 ENCOUNTER — Encounter: Payer: Self-pay | Admitting: Family Medicine

## 2021-08-18 MED ORDER — TIRZEPATIDE 12.5 MG/0.5ML ~~LOC~~ SOAJ: SUBCUTANEOUS | 1 refills | Status: DC

## 2021-08-18 NOTE — Addendum Note (Signed)
Addended by: Dairl Ponder on: 08/18/2021 04:42 PM ? ? Modules accepted: Orders ? ?

## 2021-08-18 NOTE — Telephone Encounter (Signed)
Nurses ?May send in 12.5 mg Mounjaro ?Once weekly dosing ?Send in 1 month with 2 refills ?He will need to do follow-up visit in June ?This is to help monitor response ?If he is tolerating the 12.5 well and he would like to go up on the dose in a month to let us know ?You may share this message with [place] ?Thanks-Dr. Nicki Reaper ?

## 2021-08-24 DIAGNOSIS — R7309 Other abnormal glucose: Secondary | ICD-10-CM | POA: Diagnosis not present

## 2021-08-24 DIAGNOSIS — E785 Hyperlipidemia, unspecified: Secondary | ICD-10-CM | POA: Diagnosis not present

## 2021-08-24 DIAGNOSIS — I10 Essential (primary) hypertension: Secondary | ICD-10-CM | POA: Diagnosis not present

## 2021-08-25 LAB — HEMOGLOBIN A1C
Est. average glucose Bld gHb Est-mCnc: 105 mg/dL
Hgb A1c MFr Bld: 5.3 % (ref 4.8–5.6)

## 2021-08-25 LAB — BASIC METABOLIC PANEL
BUN/Creatinine Ratio: 13 (ref 9–20)
BUN: 13 mg/dL (ref 6–24)
CO2: 26 mmol/L (ref 20–29)
Calcium: 9.7 mg/dL (ref 8.7–10.2)
Chloride: 101 mmol/L (ref 96–106)
Creatinine, Ser: 0.99 mg/dL (ref 0.76–1.27)
Glucose: 80 mg/dL (ref 70–99)
Potassium: 4.2 mmol/L (ref 3.5–5.2)
Sodium: 141 mmol/L (ref 134–144)
eGFR: 91 mL/min/{1.73_m2} (ref >59)

## 2021-08-25 LAB — LIPID PANEL
Chol/HDL Ratio: 3 ratio (ref 0.0–5.0)
Cholesterol, Total: 170 mg/dL (ref 100–199)
HDL: 56 mg/dL (ref >39)
LDL Chol Calc (NIH): 104 mg/dL — ABNORMAL HIGH (ref 0–99)
Triglycerides: 47 mg/dL (ref 0–149)
VLDL Cholesterol Cal: 10 mg/dL (ref 5–40)

## 2021-08-31 ENCOUNTER — Ambulatory Visit: Payer: BC Managed Care – PPO | Admitting: Family Medicine

## 2021-09-16 ENCOUNTER — Other Ambulatory Visit: Payer: Self-pay | Admitting: Family Medicine

## 2021-09-16 MED ORDER — TIRZEPATIDE 12.5 MG/0.5ML ~~LOC~~ SOAJ: SUBCUTANEOUS | 2 refills | Status: DC

## 2021-10-13 ENCOUNTER — Other Ambulatory Visit: Payer: Self-pay | Admitting: Family Medicine

## 2021-10-13 MED ORDER — TIRZEPATIDE 15 MG/0.5ML ~~LOC~~ SOAJ: SUBCUTANEOUS | 2 refills | Status: DC

## 2021-10-13 NOTE — Addendum Note (Signed)
Addended by: Vicente Males on: 10/13/2021 04:13 PM ? ? Modules accepted: Orders ? ?

## 2021-10-13 NOTE — Telephone Encounter (Signed)
May increase Mounjaro ?15 mg weekly, 1 month supply with 2 refills ?Follow-up office visit later in the summer best to go ahead and schedule now because of busyness of schedule ?Back in the spring I sent in multiple refills of his blood pressure medicine should be available at pharmacy ?

## 2021-10-18 ENCOUNTER — Emergency Department (HOSPITAL_COMMUNITY): Payer: BC Managed Care – PPO

## 2021-10-18 ENCOUNTER — Encounter (HOSPITAL_COMMUNITY): Payer: Self-pay

## 2021-10-18 ENCOUNTER — Emergency Department (HOSPITAL_COMMUNITY)
Admission: EM | Admit: 2021-10-18 | Discharge: 2021-10-18 | Disposition: A | Discharge status: Home / Self Care | Payer: BC Managed Care – PPO | Attending: Emergency Medicine | Admitting: Emergency Medicine

## 2021-10-18 ENCOUNTER — Other Ambulatory Visit: Payer: Self-pay

## 2021-10-18 DIAGNOSIS — K8020 Calculus of gallbladder without cholecystitis without obstruction: Secondary | ICD-10-CM | POA: Diagnosis not present

## 2021-10-18 DIAGNOSIS — N50812 Left testicular pain: Secondary | ICD-10-CM | POA: Diagnosis not present

## 2021-10-18 DIAGNOSIS — Z85528 Personal history of other malignant neoplasm of kidney: Secondary | ICD-10-CM | POA: Insufficient documentation

## 2021-10-18 DIAGNOSIS — N50819 Testicular pain, unspecified: Secondary | ICD-10-CM | POA: Diagnosis not present

## 2021-10-18 DIAGNOSIS — R1030 Lower abdominal pain, unspecified: Secondary | ICD-10-CM | POA: Diagnosis not present

## 2021-10-18 DIAGNOSIS — K5730 Diverticulosis of large intestine without perforation or abscess without bleeding: Secondary | ICD-10-CM | POA: Diagnosis not present

## 2021-10-18 DIAGNOSIS — R102 Pelvic and perineal pain: Secondary | ICD-10-CM | POA: Diagnosis not present

## 2021-10-18 LAB — CBC WITH DIFFERENTIAL/PLATELET
Abs Immature Granulocytes: 0.02 10*3/uL (ref 0.00–0.07)
Basophils Absolute: 0 10*3/uL (ref 0.0–0.1)
Basophils Relative: 0 %
Eosinophils Absolute: 0.1 10*3/uL (ref 0.0–0.5)
Eosinophils Relative: 2 %
HCT: 39.7 % (ref 39.0–52.0)
Hemoglobin: 13.2 g/dL (ref 13.0–17.0)
Immature Granulocytes: 0 %
Lymphocytes Relative: 27 %
Lymphs Abs: 2.1 10*3/uL (ref 0.7–4.0)
MCH: 28.1 pg (ref 26.0–34.0)
MCHC: 33.2 g/dL (ref 30.0–36.0)
MCV: 84.5 fL (ref 80.0–100.0)
Monocytes Absolute: 0.6 10*3/uL (ref 0.1–1.0)
Monocytes Relative: 7 %
Neutro Abs: 5 10*3/uL (ref 1.7–7.7)
Neutrophils Relative %: 64 %
Platelets: 193 10*3/uL (ref 150–400)
RBC: 4.7 MIL/uL (ref 4.22–5.81)
RDW: 14.3 % (ref 11.5–15.5)
WBC: 7.8 10*3/uL (ref 4.0–10.5)
nRBC: 0 % (ref 0.0–0.2)

## 2021-10-18 LAB — URINALYSIS, ROUTINE W REFLEX MICROSCOPIC
Bilirubin Urine: NEGATIVE
Glucose, UA: NEGATIVE mg/dL
Hgb urine dipstick: NEGATIVE
Ketones, ur: 20 mg/dL — AB
Leukocytes,Ua: NEGATIVE
Nitrite: NEGATIVE
Protein, ur: NEGATIVE mg/dL
Specific Gravity, Urine: 1.025 (ref 1.005–1.030)
pH: 5 (ref 5.0–8.0)

## 2021-10-18 LAB — BASIC METABOLIC PANEL
Anion gap: 6 (ref 5–15)
BUN: 15 mg/dL (ref 6–20)
CO2: 26 mmol/L (ref 22–32)
Calcium: 9.1 mg/dL (ref 8.9–10.3)
Chloride: 106 mmol/L (ref 98–111)
Creatinine, Ser: 1.03 mg/dL (ref 0.61–1.24)
GFR, Estimated: >60 mL/min (ref >60)
Glucose, Bld: 86 mg/dL (ref 70–99)
Potassium: 3.5 mmol/L (ref 3.5–5.1)
Sodium: 138 mmol/L (ref 135–145)

## 2021-10-18 MED ORDER — METHOCARBAMOL 500 MG PO TABS: 500.0000 mg | ORAL_TABLET | Freq: Three times a day (TID) | ORAL | 0 refills | Status: DC | PRN

## 2021-10-18 NOTE — ED Provider Notes (Signed)
  Physical Exam  BP (!) 159/108   Pulse 66   Temp 97.7 F (36.5 C) (Oral)   Resp 18   Ht '5\' 7"'$  (1.702 m)   Wt 131.5 kg   SpO2 97%   BMI 45.41 kg/m   Physical Exam  Procedures  Procedures  ED Course / MDM    Medical Decision Making Amount and/or Complexity of Data Reviewed Labs: ordered. Radiology: ordered.  Risk Prescription drug management.   Received patient in signout.  Lower abdominal/pelvic/groin pain.  Work-up reassuring.  Ultrasound and CT scan reassuring.  Discussed with patient about the pulmonary findings and he will follow-up with that.  States he has been told about them in the past.  Feeling better.  Urine does not show infection.  Will discharge home with some muscle relaxers.       Davonna Belling, MD 10/18/21 1000

## 2021-10-18 NOTE — ED Triage Notes (Signed)
Pt presents to ED with groin pain that started yesterday after lifting/moving something, says "it feels like he has a hernia or pulled something" pt reports normal urination.

## 2021-10-18 NOTE — ED Provider Notes (Signed)
[facility] EMERGENCY DEPARTMENT Provider Note   CSN: [id number] Arrival date & time: 10/18/21  0537     History  Chief Complaint  Patient presents with   Groin Pain    [name] is a 56 y.o. male.  Patient presents to the emergency department for evaluation of suprapubic pain and testicular pain.  He reports that he was picking up chairs yesterday and felt the pain.  He has not had any urethral discharge, dysuria, hematuria.      Home Medications Prior to Admission medications   Medication Sig Start Date End Date Taking? Authorizing Provider  tirzepatide [facility]) 15 MG/0.5ML Pen Inject 15 mg into skin weekly 10/13/21   Kathyrn Drown, MD  triamterene-hydrochlorothiazide (MAXZIDE) 75-50 MG tablet Take 1 tablet by mouth daily. 08/03/21   Kathyrn Drown, MD      Allergies    Patient has no known allergies.    Review of Systems   Review of Systems  Physical Exam Updated Vital Signs BP (!) 148/107   Pulse 67   Temp 97.7 F (36.5 C) (Oral)   Resp 18   Ht '5\' 7"'$  (1.702 m)   Wt 131.5 kg   SpO2 100%   BMI 45.41 kg/m  Physical Exam Vitals and nursing note reviewed.  Constitutional:      General: He is not in acute distress.    Appearance: He is well-developed.  HENT:     Head: Normocephalic and atraumatic.     Mouth/Throat:     Mouth: Mucous membranes are moist.  Eyes:     General: Vision grossly intact. Gaze aligned appropriately.     Extraocular Movements: Extraocular movements intact.     Conjunctiva/sclera: Conjunctivae normal.  Cardiovascular:     Rate and Rhythm: Normal rate and regular rhythm.     Pulses: Normal pulses.     Heart sounds: Normal heart sounds, S1 normal and S2 normal. No murmur heard.   No friction rub. No gallop.  Pulmonary:     Effort: Pulmonary effort is normal. No respiratory distress.     Breath sounds: Normal breath sounds.  Abdominal:     Palpations: Abdomen is soft.     Tenderness: There is no abdominal tenderness. There  is no guarding or rebound.     Hernia: No hernia is present. There is no hernia in the left inguinal area or right inguinal area.  Genitourinary:    Penis: Normal and circumcised.      Testes: Normal. Cremasteric reflex is present.        Right: Mass, tenderness or swelling not present.        Left: Mass, tenderness or swelling not present.     Epididymis:     Right: Normal.     Left: Tenderness present.  Musculoskeletal:        General: No swelling.     Cervical back: Full passive range of motion without pain, normal range of motion and neck supple. No pain with movement, spinous process tenderness or muscular tenderness. Normal range of motion.     Right lower leg: No edema.     Left lower leg: No edema.  Skin:    General: Skin is warm and dry.     Capillary Refill: Capillary refill takes less than 2 seconds.     Findings: No ecchymosis, erythema, lesion or wound.  Neurological:     Mental Status: He is alert and oriented to person, place, and time.  GCS: GCS eye subscore is 4. GCS verbal subscore is 5. GCS motor subscore is 6.     Cranial Nerves: Cranial nerves 2-12 are intact.     Sensory: Sensation is intact.     Motor: Motor function is intact. No weakness or abnormal muscle tone.     Coordination: Coordination is intact.  Psychiatric:        Mood and Affect: Mood normal.        Speech: Speech normal.        Behavior: Behavior normal.    ED Results / Procedures / Treatments   Labs (all labs ordered are listed, but only abnormal results are displayed) Labs Reviewed  URINE CULTURE  CBC WITH DIFFERENTIAL/PLATELET  BASIC METABOLIC PANEL  URINALYSIS, ROUTINE W REFLEX MICROSCOPIC  GC/CHLAMYDIA PROBE AMP ([place]) NOT AT [facility]    EKG None  Radiology No results found.  Procedures Procedures    Medications Ordered in ED Medications - No data to display  ED Course/ Medical Decision Making/ A&P                           Medical Decision Making Amount  and/or Complexity of Data Reviewed Labs: ordered. Radiology: ordered.   Presents to the emergency department for evaluation of lower abdominal/suprapubic pain with pain into his groin bilaterally and testicles.  I do not appreciate any hernia on exam.  There is no testicular swelling and no testicular tenderness or mass.  He does have some epididymal tenderness.  Patient does have a history of renal cell carcinoma on the right.  Differential diagnosis includes urinary tract infection, epididymo-orchitis, testicular torsion, kidney stone, inguinal hernia.  We will perform CT renal study as well as scrotal ultrasound to rule out ureterolithiasis, recurrent renal cell cancer, testicular torsion and evaluate for possible testicular infection.  Will sign to oncoming ER physician with imaging pending.        Final Clinical Impression(s) / ED Diagnoses Final diagnoses:  Lower abdominal pain  Pain in testicle, unspecified laterality    Rx / DC Orders ED Discharge Orders     None         Orpah Greek, MD 10/18/21 [phone]

## 2021-10-19 LAB — GC/CHLAMYDIA PROBE AMP (~~LOC~~) NOT AT ARMC
Chlamydia: NEGATIVE
Comment: NEGATIVE
Comment: NORMAL
Neisseria Gonorrhea: NEGATIVE

## 2021-10-20 ENCOUNTER — Encounter: Payer: Self-pay | Admitting: Family Medicine

## 2021-10-20 ENCOUNTER — Other Ambulatory Visit: Payer: Self-pay | Admitting: Family Medicine

## 2021-10-20 DIAGNOSIS — J849 Interstitial pulmonary disease, unspecified: Secondary | ICD-10-CM

## 2021-10-20 LAB — URINE CULTURE: Culture: <10000 — AB

## 2021-10-20 NOTE — Telephone Encounter (Signed)
Nurses I looked at the report I also looked at the images.  No growth but patient does have some findings that could be what is referred to as interstitial lung disease.  This needs further looking into by pulmonary.  Recommend pulmonary referral.[place] Also I can do a ER visit follow-up this week or early next week if patient is interested in doing so. On report he also has gallstones without evidence of cholecystitis. He also has atherosclerosis of the aorta Will need to do up-to-date lab work Also need to discuss statin treatments I highly recommend that the patient do a follow-up visit with myself somewhere within the next 2 weeks.  May go ahead with referral to get the ball rolling regarding pulmonary

## 2021-11-17 ENCOUNTER — Telehealth: Payer: Self-pay

## 2021-11-17 NOTE — Telephone Encounter (Signed)
Patient informed of md message and recommendations. Verbalized understanding. 

## 2021-11-17 NOTE — Telephone Encounter (Signed)
Unfortunately this is an ongoing issue He should talk with his pharmacy to see if they have a 12.5 It is better for the patient to be on some such as the 12.5 mg weekly then to be on nothing If they do have the 12.5 and he needs a prescription he can notify us and we can send it in

## 2021-11-17 NOTE — Telephone Encounter (Signed)
Caller name:Taveon Dorning   On DPR? :Yes  Call back number:[phone]  Provider they see: [place]   Reason for call:Pt wanted let Dr Nicki Reaper know that the tirzepatide [facility]) 15 MG/0.5ML Pen hye has not had in two months because they have been short stock

## 2021-12-14 ENCOUNTER — Encounter: Payer: Self-pay | Admitting: Family Medicine

## 2021-12-14 ENCOUNTER — Ambulatory Visit (INDEPENDENT_AMBULATORY_CARE_PROVIDER_SITE_OTHER): Payer: BC Managed Care – PPO | Admitting: Family Medicine

## 2021-12-14 VITALS — BP 122/86 | Temp 97.9°F | Wt 271.0 lb

## 2021-12-14 DIAGNOSIS — R7309 Other abnormal glucose: Secondary | ICD-10-CM | POA: Diagnosis not present

## 2021-12-14 DIAGNOSIS — Z79899 Other long term (current) drug therapy: Secondary | ICD-10-CM

## 2021-12-14 DIAGNOSIS — R739 Hyperglycemia, unspecified: Secondary | ICD-10-CM

## 2021-12-14 DIAGNOSIS — I10 Essential (primary) hypertension: Secondary | ICD-10-CM | POA: Diagnosis not present

## 2021-12-14 DIAGNOSIS — E785 Hyperlipidemia, unspecified: Secondary | ICD-10-CM

## 2021-12-14 MED ORDER — TRIAMTERENE-HCTZ 75-50 MG PO TABS: 1.0000 | ORAL_TABLET | Freq: Every day | ORAL | 2 refills | Status: DC

## 2021-12-14 MED ORDER — TIRZEPATIDE 12.5 MG/0.5ML ~~LOC~~ SOAJ: 12.5000 mg | SUBCUTANEOUS | 5 refills | Status: DC

## 2021-12-14 NOTE — Progress Notes (Signed)
   Subjective:    Patient ID: [name], male    DOB: 06-23-1965, 56 y.o.   MRN: [id number]  HPI Pt here to follow up on medications. Pt states he has lost about 90 lbs. Pt states blood pressure has been doing well. Pt has been using 12.5 mg of Mounjaro.  Patient is trying to do the right thing with eating healthy staying physically active He is watching his portion control and selection Avoiding sugary drinks Tolerating Mounjaro well Taking his blood pressure medicine regular basis   Review of Systems     Objective:   Physical Exam General-in no acute distress Eyes-no discharge Lungs-respiratory rate normal, CTA CV-no murmurs,RRR Extremities skin warm dry no edema Neuro grossly normal Behavior normal, alert        Assessment & Plan:  1. Hyperlipidemia, unspecified hyperlipidemia type Cholesterol check labs watch diet - Lipid panel - Basic metabolic panel - PSA - Hemoglobin A1c - Hepatic Function Panel  2. Elevated hemoglobin A1c History of prediabetes morbid obesity check A1c - Lipid panel - Basic metabolic panel - PSA - Hemoglobin A1c - Hepatic Function Panel  3. HTN (hypertension), benign Blood pressure good control continue current measures watch diet check labs - Lipid panel - Basic metabolic panel - PSA - Hemoglobin A1c - Hepatic Function Panel  4. Morbid obesity ([place]) Portion control Mounjaro 12.5-medication is clinically indicated because of his morbid obesity hyperlipidemia and increased risk of cardiovascular disease Healthy diet regular physical activity - Lipid panel - Basic metabolic panel - PSA - Hemoglobin A1c - Hepatic Function Panel  5. High risk medication use Labs ordered - Lipid panel - Basic metabolic panel - PSA - Hemoglobin A1c - Hepatic Function Panel  6. Hyperglycemia Labs ordered - Lipid panel - Basic metabolic panel - PSA - Hemoglobin A1c - Hepatic Function Panel  To do lab work and a wellness in  October

## 2022-03-03 ENCOUNTER — Encounter: Payer: Self-pay | Admitting: *Deleted

## 2022-03-04 ENCOUNTER — Ambulatory Visit: Payer: BC Managed Care – PPO | Admitting: Adult Health

## 2022-03-05 DIAGNOSIS — I10 Essential (primary) hypertension: Secondary | ICD-10-CM | POA: Diagnosis not present

## 2022-03-05 DIAGNOSIS — Z79899 Other long term (current) drug therapy: Secondary | ICD-10-CM | POA: Diagnosis not present

## 2022-03-05 DIAGNOSIS — E785 Hyperlipidemia, unspecified: Secondary | ICD-10-CM | POA: Diagnosis not present

## 2022-03-05 DIAGNOSIS — R739 Hyperglycemia, unspecified: Secondary | ICD-10-CM | POA: Diagnosis not present

## 2022-03-05 DIAGNOSIS — R7309 Other abnormal glucose: Secondary | ICD-10-CM | POA: Diagnosis not present

## 2022-03-06 LAB — BASIC METABOLIC PANEL
BUN/Creatinine Ratio: 14 (ref 9–20)
BUN: 14 mg/dL (ref 6–24)
CO2: 29 mmol/L (ref 20–29)
Calcium: 9.3 mg/dL (ref 8.7–10.2)
Chloride: 99 mmol/L (ref 96–106)
Creatinine, Ser: 1.03 mg/dL (ref 0.76–1.27)
Glucose: 80 mg/dL (ref 70–99)
Potassium: 3.8 mmol/L (ref 3.5–5.2)
Sodium: 138 mmol/L (ref 134–144)
eGFR: 86 mL/min/{1.73_m2} (ref >59)

## 2022-03-06 LAB — HEPATIC FUNCTION PANEL
ALT: 11 IU/L (ref 0–44)
AST: 18 IU/L (ref 0–40)
Albumin: 4.3 g/dL (ref 3.8–4.9)
Alkaline Phosphatase: 60 IU/L (ref 44–121)
Bilirubin Total: 0.5 mg/dL (ref 0.0–1.2)
Bilirubin, Direct: 0.2 mg/dL (ref 0.00–0.40)
Total Protein: 7 g/dL (ref 6.0–8.5)

## 2022-03-06 LAB — LIPID PANEL
Chol/HDL Ratio: 2.6 ratio (ref 0.0–5.0)
Cholesterol, Total: 179 mg/dL (ref 100–199)
HDL: 68 mg/dL (ref >39)
LDL Chol Calc (NIH): 102 mg/dL — ABNORMAL HIGH (ref 0–99)
Triglycerides: 47 mg/dL (ref 0–149)
VLDL Cholesterol Cal: 9 mg/dL (ref 5–40)

## 2022-03-06 LAB — PSA: Prostate Specific Ag, Serum: 0.7 ng/mL (ref 0.0–4.0)

## 2022-03-06 LAB — HEMOGLOBIN A1C
Est. average glucose Bld gHb Est-mCnc: 111 mg/dL
Hgb A1c MFr Bld: 5.5 % (ref 4.8–5.6)

## 2022-03-08 ENCOUNTER — Encounter: Payer: Self-pay | Admitting: Family Medicine

## 2022-03-08 ENCOUNTER — Ambulatory Visit (INDEPENDENT_AMBULATORY_CARE_PROVIDER_SITE_OTHER): Payer: BC Managed Care – PPO | Admitting: Family Medicine

## 2022-03-08 VITALS — BP 120/86 | Ht 67.0 in | Wt 278.8 lb

## 2022-03-08 DIAGNOSIS — Z0000 Encounter for general adult medical examination without abnormal findings: Secondary | ICD-10-CM

## 2022-03-08 DIAGNOSIS — Z23 Encounter for immunization: Secondary | ICD-10-CM

## 2022-03-08 DIAGNOSIS — E785 Hyperlipidemia, unspecified: Secondary | ICD-10-CM

## 2022-03-08 DIAGNOSIS — L299 Pruritus, unspecified: Secondary | ICD-10-CM | POA: Diagnosis not present

## 2022-03-08 DIAGNOSIS — I10 Essential (primary) hypertension: Secondary | ICD-10-CM | POA: Diagnosis not present

## 2022-03-08 NOTE — Patient Instructions (Signed)

## 2022-03-08 NOTE — Progress Notes (Signed)
   Subjective:    Patient ID: [name], male    DOB: 11/03/1965, 56 y.o.   MRN: [id number]  HPI The patient comes in today for a wellness visit.  Patient has done a fantastic job losing weight but recently stopped his Darcel Bayley because of some logistical issues he has not taken it in approximately 2 weeks he states his weight over the past month is gone up 15 to 20 pounds  A review of their health history was completed.  A review of medications was also completed.  Any needed refills; not at this time  Eating habits: Good  Falls/  MVA accidents in past few months: none  Regular exercise: walking/moving on the job regular   Specialist pt sees on regular basis: none  Preventative health issues were discussed.   Additional concerns:   The 10-year ASCVD risk score (Arnett DK, et al., 2019) is: 10.7%   Values used to calculate the score:     Age: 40 years     Sex: Male     Is Non-Hispanic African American: Yes     Diabetic: No     Tobacco smoker: No     Systolic Blood Pressure: 568 mmHg     Is BP treated: Yes     HDL Cholesterol: 68 mg/dL     Total Cholesterol: 179 mg/dL   Review of Systems     Objective:   Physical Exam General-in no acute distress Eyes-no discharge Lungs-respiratory rate normal, CTA CV-no murmurs,RRR Extremities skin warm dry no edema Neuro grossly normal Behavior normal, alert  From a dietary perspective he needs to do a better job of trying to be careful with his eating avoiding foods that are not healthy for him and working hard at trying to lose weight      Assessment & Plan:  1. Well adult exam Adult wellness-complete.wellness physical was conducted today. Importance of diet and exercise were discussed in detail.  Importance of stress reduction and healthy living were discussed.  In addition to this a discussion regarding safety was also covered.  We also reviewed over immunizations and gave recommendations regarding current  immunization needed for age.   In addition to this additional areas were also touched on including: Preventative health exams needed:  Colonoscopy 2031  Patient was advised yearly wellness exam   2. Itching This could be a nerve impingement of the sensory nerve I see no signs of shingles recommend lotions moisturizers  3. HTN (hypertension), benign Blood pressure good control  4. Hyperlipidemia, unspecified hyperlipidemia type LDL is not terrible but his risk for heart disease 10% very important for him to consider cardiac calcium score - CT CARDIAC SCORING (SELF PAY ONLY)  5. Need for vaccination Flu shot today - Flu Vaccine QUAD 6+ mos PF IM (Fluarix Quad PF)  Morbid obesity with prediabetes continue Mounjaro.  This medication benefits him to keep his weight down.  He has seen significant improvement  Follow-up in 4 months

## 2022-03-09 ENCOUNTER — Ambulatory Visit: Payer: BC Managed Care – PPO | Admitting: Adult Health

## 2022-03-15 ENCOUNTER — Encounter: Payer: Self-pay | Admitting: *Deleted

## 2022-03-16 ENCOUNTER — Telehealth: Payer: BC Managed Care – PPO | Admitting: Adult Health

## 2022-03-29 ENCOUNTER — Ambulatory Visit (HOSPITAL_COMMUNITY)
Admission: RE | Admit: 2022-03-29 | Discharge: 2022-03-29 | Disposition: A | Discharge status: Home / Self Care | Payer: BC Managed Care – PPO | Source: Ambulatory Visit | Attending: Family Medicine | Admitting: Family Medicine

## 2022-03-29 DIAGNOSIS — E785 Hyperlipidemia, unspecified: Secondary | ICD-10-CM | POA: Insufficient documentation

## 2022-04-03 ENCOUNTER — Other Ambulatory Visit: Payer: Self-pay | Admitting: Family Medicine

## 2022-04-05 ENCOUNTER — Telehealth: Payer: Self-pay | Admitting: Family Medicine

## 2022-04-05 DIAGNOSIS — Z79899 Other long term (current) drug therapy: Secondary | ICD-10-CM

## 2022-04-05 DIAGNOSIS — E785 Hyperlipidemia, unspecified: Secondary | ICD-10-CM

## 2022-04-05 DIAGNOSIS — I10 Essential (primary) hypertension: Secondary | ICD-10-CM

## 2022-04-05 DIAGNOSIS — R739 Hyperglycemia, unspecified: Secondary | ICD-10-CM

## 2022-04-05 NOTE — Telephone Encounter (Signed)
I sent in his refills of Mounjaro  Please have patient do A1c, lipid, liver, metabolic 7, urine ACR before next follow-up visit he has a follow-up visit in February he can do it right before then you may send him a MyChart message once these are ordered Enck you

## 2022-04-06 ENCOUNTER — Encounter: Payer: Self-pay | Admitting: *Deleted

## 2022-04-06 NOTE — Telephone Encounter (Signed)
Blood work ordered in [facility]. Patient notified via my chart

## 2022-04-29 ENCOUNTER — Other Ambulatory Visit: Payer: Self-pay

## 2022-04-29 ENCOUNTER — Emergency Department (HOSPITAL_COMMUNITY)
Admission: EM | Admit: 2022-04-29 | Discharge: 2022-04-29 | Disposition: A | Discharge status: Home / Self Care | Payer: BC Managed Care – PPO | Attending: Emergency Medicine | Admitting: Emergency Medicine

## 2022-04-29 ENCOUNTER — Encounter (HOSPITAL_COMMUNITY): Payer: Self-pay | Admitting: Emergency Medicine

## 2022-04-29 DIAGNOSIS — M25512 Pain in left shoulder: Secondary | ICD-10-CM | POA: Diagnosis not present

## 2022-04-29 DIAGNOSIS — M778 Other enthesopathies, not elsewhere classified: Secondary | ICD-10-CM | POA: Diagnosis not present

## 2022-04-29 DIAGNOSIS — M779 Enthesopathy, unspecified: Secondary | ICD-10-CM

## 2022-04-29 MED ORDER — MELOXICAM 15 MG PO TABS: 15.0000 mg | ORAL_TABLET | Freq: Every day | ORAL | 0 refills | Status: DC

## 2022-04-29 MED ORDER — DEXAMETHASONE SODIUM PHOSPHATE 10 MG/ML IJ SOLN
10.0000 mg | Freq: Once | INTRAMUSCULAR | Status: AC
  Administered 2022-04-29: 10 mg via INTRAMUSCULAR
  Filled 2022-04-29: qty 1

## 2022-04-29 MED ORDER — KETOROLAC TROMETHAMINE 60 MG/2ML IM SOLN
60.0000 mg | Freq: Once | INTRAMUSCULAR | Status: AC
  Administered 2022-04-29: 60 mg via INTRAMUSCULAR
  Filled 2022-04-29: qty 2

## 2022-04-29 NOTE — ED Triage Notes (Signed)
Pt c/o left shoulder pain. Pt states pain has been there for awhile but it was worse yesterday when he got up for work. Pt states pain increases with any movement.

## 2022-04-29 NOTE — ED Provider Notes (Signed)
[facility] EMERGENCY DEPARTMENT Provider Note   CSN: [id number] Arrival date & time: 04/29/22  0601     History  Chief Complaint  Patient presents with   Shoulder Pain    [name] is a 56 y.o. male.  Patient presents to the emergency department for evaluation of left shoulder pain.  Patient reports that pain began several days ago.  At first it was minor but it has progressively worsened.  Tonight he cannot move the shoulder because of severe pain.  He denies any direct injury.       Home Medications Prior to Admission medications   Medication Sig Start Date End Date Taking? Authorizing Provider  meloxicam (MOBIC) 15 MG tablet Take 1 tablet (15 mg total) by mouth daily. 04/29/22  Yes Orpah Greek, MD  tirzepatide [facility]) 12.5 MG/0.5ML Pen INJECT 12.5 MG INTO SKIN ONCE A WEEK 04/05/22   Kathyrn Drown, MD  triamterene-hydrochlorothiazide (MAXZIDE) 75-50 MG tablet Take 1 tablet by mouth daily. 12/14/21   Kathyrn Drown, MD      Allergies    Patient has no known allergies.    Review of Systems   Review of Systems  Physical Exam Updated Vital Signs BP 114/75 (BP Location: Right Arm)   Pulse 87   Temp 98.1 F (36.7 C) (Oral)   Resp 19   Ht '5\' 7"'$  (1.702 m)   Wt 115.7 kg   SpO2 97%   BMI 39.94 kg/m  Physical Exam Vitals and nursing note reviewed.  Constitutional:      General: He is not in acute distress.    Appearance: He is well-developed.  HENT:     Head: Normocephalic and atraumatic.     Mouth/Throat:     Mouth: Mucous membranes are moist.  Eyes:     General: Vision grossly intact. Gaze aligned appropriately.     Extraocular Movements: Extraocular movements intact.     Conjunctiva/sclera: Conjunctivae normal.  Cardiovascular:     Rate and Rhythm: Normal rate and regular rhythm.     Pulses: Normal pulses.     Heart sounds: Normal heart sounds, S1 normal and S2 normal. No murmur heard.    No friction rub. No gallop.  Pulmonary:      Effort: Pulmonary effort is normal. No respiratory distress.     Breath sounds: Normal breath sounds.  Abdominal:     Palpations: Abdomen is soft.     Tenderness: There is no abdominal tenderness. There is no guarding or rebound.     Hernia: No hernia is present.  Musculoskeletal:        General: No swelling.     Left shoulder: Tenderness present. No swelling, deformity or effusion. Decreased range of motion.       Arms:     Cervical back: Full passive range of motion without pain, normal range of motion and neck supple. No pain with movement, spinous process tenderness or muscular tenderness. Normal range of motion.     Right lower leg: No edema.     Left lower leg: No edema.  Skin:    General: Skin is warm and dry.     Capillary Refill: Capillary refill takes less than 2 seconds.     Findings: No ecchymosis, erythema, lesion or wound.  Neurological:     Mental Status: He is alert and oriented to person, place, and time.     GCS: GCS eye subscore is 4. GCS verbal subscore is 5. GCS motor subscore is  6.     Cranial Nerves: Cranial nerves 2-12 are intact.     Sensory: Sensation is intact.     Motor: Motor function is intact. No weakness or abnormal muscle tone.     Coordination: Coordination is intact.  Psychiatric:        Mood and Affect: Mood normal.        Speech: Speech normal.        Behavior: Behavior normal.     ED Results / Procedures / Treatments   Labs (all labs ordered are listed, but only abnormal results are displayed) Labs Reviewed - No data to display  EKG None  Radiology No results found.  Procedures Procedures    Medications Ordered in ED Medications  dexamethasone (DECADRON) injection 10 mg (has no administration in time range)  ketorolac (TORADOL) injection 60 mg (has no administration in time range)    ED Course/ Medical Decision Making/ A&P                           Medical Decision Making Risk Prescription drug management.   Presents  with nontraumatic left shoulder pain.  Pain is clearly musculoskeletal in nature.  Patient has significant increase in pain with movement.  He cannot raise the arm over his head because of painful intubation.  There is also pain with passive range of motion.  Maximal tenderness is at the proximal biceps tendon region.  Given shot of Decadron and Toradol.  Sling for comfort.  Mobic as needed.  Work note provided.  Follow-up with PCP next week if not improving for further evaluation, possible physical therapy if needed.        Final Clinical Impression(s) / ED Diagnoses Final diagnoses:  Acute pain of left shoulder  Tendinitis    Rx / DC Orders ED Discharge Orders          Ordered    meloxicam (MOBIC) 15 MG tablet  Daily        04/29/22 0631              Orpah Greek, MD 04/29/22 [phone]

## 2022-05-26 ENCOUNTER — Other Ambulatory Visit: Payer: Self-pay | Admitting: Family Medicine

## 2022-05-28 ENCOUNTER — Encounter: Payer: Self-pay | Admitting: Family Medicine

## 2022-06-02 NOTE — Telephone Encounter (Signed)
Nurses Recommend trying to get prior approval for this medication 12.5 mg is a dosage he is currently taking  Nurses-please see me regarding this issue so we can move forward with for prior approval  You may also share the following message with [name]  Dear [place] have shifted toward being more stringent about these particular medications.  Mainly because these medicines tend to be very expensive.  We are finding that most insurance companies will not approve these medicines unless a person is a poorly controlled diabetic or is a diabetic who is failed additional medicines.  Currently your A1c is more in the prediabetes phase.  Certainly this medicine has helped you in regards to losing weight.  I am hoping your insurance company will allow [place] to get the approval of it.  But we are at the mercy of the insurance company regarding this-often if there are new guidelines are strict there may be no getting around their guidelines.  We will be trying we will keep you updated with what we find out.  Thanks-Dr. Nicki Reaper

## 2022-06-07 NOTE — Telephone Encounter (Signed)
[facility] has been informed [facility] his moujaro 12.5 mg does not currently need any PA , he will be calling around at different pharmacies to check availability. [facility] not currently [facility].

## 2022-07-09 ENCOUNTER — Ambulatory Visit (INDEPENDENT_AMBULATORY_CARE_PROVIDER_SITE_OTHER): Payer: BC Managed Care – PPO | Admitting: Family Medicine

## 2022-07-09 ENCOUNTER — Encounter: Payer: Self-pay | Admitting: Family Medicine

## 2022-07-09 VITALS — BP 143/90 | Wt 280.0 lb

## 2022-07-09 DIAGNOSIS — R361 Hematospermia: Secondary | ICD-10-CM | POA: Diagnosis not present

## 2022-07-09 DIAGNOSIS — E785 Hyperlipidemia, unspecified: Secondary | ICD-10-CM

## 2022-07-09 DIAGNOSIS — I10 Essential (primary) hypertension: Secondary | ICD-10-CM | POA: Diagnosis not present

## 2022-07-09 DIAGNOSIS — R739 Hyperglycemia, unspecified: Secondary | ICD-10-CM

## 2022-07-09 LAB — POCT URINALYSIS DIP (CLINITEK)
Spec Grav, UA: >=1.03 — AB (ref 1.010–1.025)
pH, UA: 6.5 (ref 5.0–8.0)

## 2022-07-09 MED ORDER — TRIAMTERENE-HCTZ 75-50 MG PO TABS: 1.0000 | ORAL_TABLET | Freq: Every day | ORAL | 2 refills | Status: DC

## 2022-07-09 MED ORDER — MOUNJARO 12.5 MG/0.5ML ~~LOC~~ SOAJ: SUBCUTANEOUS | 3 refills | Status: DC

## 2022-07-09 NOTE — Progress Notes (Unsigned)
   Subjective:    Patient ID: [name], male    DOB: February 07, 1966, 57 y.o.   MRN: [id number]  HPI Pt arrives for follow up. Pt states blood pressure has been good. No s/s HTN. Does check blood pressure at home some times.  Patient did not take medication this morning States most of the time readings seem to be reasonable Denies any major setbacks or problems He had workup for hematuria approximately 4 years ago.  He does have a remote history of a partial nephrectomy for kidney cancer Utilizes Mounjaro on a regular basis but sometimes has difficulty finding Pt currently still on [employer] whenever he can obtain it.   Review of Systems     Objective:   Physical Exam  General-in no acute distress Eyes-no discharge Lungs-respiratory rate normal, CTA CV-no murmurs,RRR Extremities skin warm dry no edema Neuro grossly normal Behavior normal, alert       Assessment & Plan:  1. Hematospermia This will need further evaluation Under the microscope see an occasional red blood cell Will discuss case with his urologist from a few years ago Has a previous partial nephrectomy history He may well need to have another scan as well as cystoscope hard to tell await urology input - POCT URINALYSIS DIP (CLINITEK)  2. Morbid obesity ([place]) Morbid obesity uses Mounjaro had difficult time getting the medicine he will let us know if he needs it sent anywhere else continue the medicine  3. Hyperlipidemia, unspecified hyperlipidemia type Check lipid profile previous coronary calcium score 0  4. HTN (hypertension), benign Blood pressure he states he did not take his medicine until yesterday he did not take it this morning he will take his blood pressure medicine here check his blood pressure readings here follow-up here within 6 to 8 weeks  5. Hyperglycemia Portion control continue Mounjaro  We will connect with urology

## 2022-07-10 ENCOUNTER — Telehealth: Payer: Self-pay | Admitting: Family Medicine

## 2022-07-10 NOTE — Patient Instructions (Signed)
Tensive

## 2022-07-10 NOTE — Telephone Encounter (Signed)
Patient with hematospermia History of partial right nephrectomy back in 2009 Has not had any reoccurrence Was seen by [facility] urology 2019 for hematuria Negative workup CT scan with cystoscope Has had 2 episodes of hematospermia We will connect with urology Dr. Burman Nieves [phone]

## 2022-07-12 LAB — URINE CULTURE

## 2022-07-19 ENCOUNTER — Other Ambulatory Visit: Payer: Self-pay | Admitting: Family Medicine

## 2022-07-20 ENCOUNTER — Encounter: Payer: Self-pay | Admitting: Family Medicine

## 2022-07-20 NOTE — Telephone Encounter (Signed)
Nurses-please talk with [facility].  I would like to speak with either urologist or one of their nurse practitioner/PA's for Dr. Burman Nieves who saw this patient back in 2019-they can call my cell #[phone]-please put a sticky note on my door indicating that this has been initiated so when they call I will be aware that it is urology regarding Mr. Ojani Farnam thank you You can use the information I have on this message as the information to relate to them on the reason why I would like to speak with them.  Trying to find out if they are going to require him to come back for further evaluation regarding this or not

## 2022-07-21 NOTE — Telephone Encounter (Signed)
Message left with Urology to have Dr. Louis Meckel call [place]. Blue sticky note placed on provider door as requested.

## 2022-07-21 NOTE — Telephone Encounter (Signed)
Message left with Urology to have Dr. Louis Meckel call [place]. Blue sticky note placed on provider door as requested.

## 2022-07-25 NOTE — Telephone Encounter (Signed)
Nurses-please let the patient know that I did discuss his case with urology.  They do not recommend any further testing at this time they state Amanda's primary is not unusual but if it keeps reoccurring they would like to see him.  Currently they do not recommend CT scan or cystoscope

## 2022-07-26 NOTE — Telephone Encounter (Signed)
Unfortunately dictation software is not always accurate " Hematospermia"-should read-hematospermia is not unusual but if it keeps reoccurring they would like to see him.  Currently based upon what is going on with him they do not recommend CT scan or cystoscope at this time  Please have patient keep all regular follow-up visits

## 2022-07-26 NOTE — Telephone Encounter (Signed)
Patient has been informed drs notes and recommendations. Patient verbalized understanding.

## 2022-08-01 ENCOUNTER — Telehealth: Payer: Self-pay | Admitting: Family Medicine

## 2022-08-01 NOTE — Telephone Encounter (Signed)
Nurses I received form regarding patient's Mounjaro I filled this out It does have a couple additional areas that need assistance with filling out Please see highlighted areas  Please note it is quite possible this will not be approved but please let the patient know that we have filled out the form and sent it in.  Currently right now most companies are not paying for [facility] unless a person is a full-fledged diabetic.  We do not have any A1c's documenting diabetes.  Only has shown prediabetes. (Chris-the clinical pharmacist was able to get this filled previously but I have no idea how he was able to do so under the stricter guidelines that most companies are utilizing now I do not think his will be approved-nonetheless it is important to keep the patient in the loop to let them know that we are trying but more than likely his company's insurance will deny this)

## 2022-08-02 NOTE — Telephone Encounter (Signed)
Patient notified and stated he has already picked up his Mounjaro and to disregard previous letter/form

## 2022-08-30 ENCOUNTER — Encounter: Payer: Self-pay | Admitting: Family Medicine

## 2022-08-30 ENCOUNTER — Ambulatory Visit (INDEPENDENT_AMBULATORY_CARE_PROVIDER_SITE_OTHER): Payer: BC Managed Care – PPO | Admitting: Family Medicine

## 2022-08-30 DIAGNOSIS — R7303 Prediabetes: Secondary | ICD-10-CM | POA: Diagnosis not present

## 2022-08-30 DIAGNOSIS — R361 Hematospermia: Secondary | ICD-10-CM

## 2022-08-30 DIAGNOSIS — I10 Essential (primary) hypertension: Secondary | ICD-10-CM

## 2022-08-30 NOTE — Progress Notes (Signed)
   Subjective:    Patient ID: [name], male    DOB: 1966/05/12, 57 y.o.   MRN: [id number]  HPI Patient arrives today for 6 to 8 week for HTN. Patient for blood pressure check up.  The patient does have hypertension.   Patient relates dietary measures try to minimize salt The importance of healthy diet and activity were discussed Patient relates compliance  Sleep apnea-does not do a good job of utilizing his CPAP machine Has a lot of daytime fatigue tiredness feels rundown Multiple days out of the week he ends up falling asleep while in the recliner and not using his CPAP We did discuss inspire but this technology recommends keeping his body mass index of 40 or less current BMI is 45 he would not qualify for this  Patient would like to talk about CPAP.  Patient with morbid obesity trying with portion control regular physical activity to bring his weight down he has been on Mounjaro for well over a year for prediabetes, reducing cardiovascular risk disease as well as reducing his weight and did help bring his weight down from well over 340 pounds down to 271 weight recently is gone up because he cannot find the [facility].   Review of Systems     Objective:   Physical Exam  General-in no acute distress Eyes-no discharge Lungs-respiratory rate normal, CTA CV-no murmurs,RRR Extremities skin warm dry no edema Neuro grossly normal Behavior normal, alert       Assessment & Plan:   1. Morbid obesity The patient's BMI is calculated.  The patient does have obesity.  The patient does try to some degree staying active and watching diet.  It is in the vital signs and acknowledged.  It is above the recommended BMI for the patient's height and weight.  The patient has been counseled regarding healthy diet, restricted portions, avoiding excessive carbohydrates/sugary foods, and increase physical activity as health permits.  It is in the patient's best interest to lower the risk of  secondary illness including heart disease strokes and cancer by losing weight.  The patient acknowledges this information.  Darcel Bayley has helped him bring his weight down.  Very difficult for patient to keep his weight down without it.  Patient does not have type 2 diabetes but does have prediabetes also at higher risk for heart disease has multiple clinical indications to stay on the medication  2. HTN (hypertension), benign Blood pressure good response continue current measures  3. Hematospermia Has not had any recently will watch this urologist states as long as its occasional no need for workup but if it gets severe to refer to them keep up annual wellness checkup he is due for this later this year he is aware  4. Prediabetes Portion control regular physical activity minimize sugary foods.

## 2022-08-30 NOTE — Patient Instructions (Signed)

## 2022-09-30 ENCOUNTER — Ambulatory Visit: Payer: BC Managed Care – PPO | Admitting: Family Medicine

## 2023-02-01 ENCOUNTER — Ambulatory Visit: Payer: BC Managed Care – PPO | Admitting: Family Medicine

## 2023-02-25 ENCOUNTER — Encounter: Payer: Self-pay | Admitting: Family Medicine

## 2023-02-25 ENCOUNTER — Ambulatory Visit (INDEPENDENT_AMBULATORY_CARE_PROVIDER_SITE_OTHER): Payer: BC Managed Care – PPO | Admitting: Family Medicine

## 2023-02-25 VITALS — BP 142/108 | HR 76 | Temp 98.4°F | Ht 67.0 in | Wt 286.0 lb

## 2023-02-25 DIAGNOSIS — Z23 Encounter for immunization: Secondary | ICD-10-CM | POA: Diagnosis not present

## 2023-02-25 DIAGNOSIS — E785 Hyperlipidemia, unspecified: Secondary | ICD-10-CM

## 2023-02-25 DIAGNOSIS — R7303 Prediabetes: Secondary | ICD-10-CM | POA: Diagnosis not present

## 2023-02-25 DIAGNOSIS — I10 Essential (primary) hypertension: Secondary | ICD-10-CM | POA: Diagnosis not present

## 2023-02-25 DIAGNOSIS — Z125 Encounter for screening for malignant neoplasm of prostate: Secondary | ICD-10-CM | POA: Diagnosis not present

## 2023-02-25 MED ORDER — TRIAMTERENE-HCTZ 75-50 MG PO TABS: 1.0000 | ORAL_TABLET | Freq: Every day | ORAL | 1 refills | Status: DC

## 2023-02-25 NOTE — Progress Notes (Signed)
   Subjective:    Patient ID: [name], male    DOB: 05/13/1966, 57 y.o.   MRN: [id number]  HPI Very nice patient He is morbidly obese but he has done a great job bringing his weight down He has held his weight down He was on Mounjaro but no longer because his insurance company no longer covers it He denies any significant setbacks He did run out of his blood pressure medicine he denies chest tightness pressure pain shortness of breath   Review of Systems     Objective:   Physical Exam General-in no acute distress Eyes-no discharge Lungs-respiratory rate normal, CTA CV-no murmurs,RRR Extremities skin warm dry no edema Neuro grossly normal Behavior normal, alert        Assessment & Plan:  Blood pressure is elevated because he ran out of his medicine we did discuss the importance of taking his medicine In addition to this we will encourage him to eat healthy stay physically active try to keep his weight down he will do his lab work and he will do a follow-up office visit in this fall for a wellness Flu shot today Labs ordered Currently GLP-1's not covered for him but down the road hopefully they will be

## 2023-04-04 ENCOUNTER — Ambulatory Visit (INDEPENDENT_AMBULATORY_CARE_PROVIDER_SITE_OTHER): Payer: BC Managed Care – PPO | Admitting: Family Medicine

## 2023-04-04 VITALS — BP 128/88 | HR 69 | Temp 98.4°F | Ht 67.0 in | Wt 297.2 lb

## 2023-04-04 DIAGNOSIS — E785 Hyperlipidemia, unspecified: Secondary | ICD-10-CM

## 2023-04-04 DIAGNOSIS — R7303 Prediabetes: Secondary | ICD-10-CM | POA: Diagnosis not present

## 2023-04-04 DIAGNOSIS — Z0001 Encounter for general adult medical examination with abnormal findings: Secondary | ICD-10-CM

## 2023-04-04 DIAGNOSIS — Z0000 Encounter for general adult medical examination without abnormal findings: Secondary | ICD-10-CM

## 2023-04-04 NOTE — Progress Notes (Signed)
Subjective:    Patient ID: [name], male    DOB: Aug 04, 1965, 57 y.o.   MRN: [id number]  HPI The patient comes in today for a wellness visit.  Discussed the use of AI scribe software for clinical note transcription with the patient, who gave verbal consent to proceed.  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 He reports a busy work schedule, often involving long hours and irregular eating habits, which he believes may be contributing to his weight issues. He mentions a recent period of three nights with minimal sleep due to work demands, during which he resorted to energy drinks to stay awake. Despite this, he reports trying to maintain a healthy diet, often opting for salads and water.  The patient has a history of struggling with weight and has previously considered weight loss surgery. He expresses concern about the cost and potential risks of such procedures, particularly in foreign countries. He also mentions a recent discussion with a healthcare provider who suggested that weight loss surgery might be a more effective long-term solution than weekly injections.  In addition to his weight concerns, the patient reports occasional tenderness in what he believes to be a hernia. (history of umbilical hernia) He expresses interest in having this addressed but is unsure whether his insurance would cover the procedure. He also mentions a recent change in bowel movement color, which he attributes to dietary changes.  The patient's family history includes a mother and a close friend who both suffered from lupus, with the mother also having had shingles. The patient expresses interest in receiving a shingles vaccine, given his family history and the associated risks. He also mentions a nodule on his gum, which he reports has been present for some time but has  not caused significant discomfort or inconvenience.  Overall, the patient's primary concerns are his weight and potential hernia, with secondary concerns about his bowel movements and the gum nodule. He expresses a desire to improve his health and is open to various treatment options, including surgery and vaccinations.       A review of their health history was completed.  A review of medications was also completed.  Any needed refills; No  Eating habits: Good  Falls/  MVA accidents in past few months: No  Regular exercise: Yes  Specialist pt sees on regular basis: No  Preventative health issues were discussed.   Additional concerns: not at this time   Review of Systems     Objective:   Physical Exam General-in no acute distress Eyes-no discharge Lungs-respiratory rate normal, CTA CV-no murmurs,RRR Extremities skin warm dry no edema Neuro grossly normal Behavior normal, alert  Prostate exam deferred Physical Exam   VITALS: BP- 128/88 HEENT: Eardrums normal. Nodule on side of gum, likely related to chewing pressure. CHEST: Heart and lungs sound good upon auscultation.           Assessment & Plan:  Assessment and Plan    Obesity Patient has a high BMI and is struggling with weight loss. Discussed the benefits of weight loss surgery and provided information on different types of procedures. -Encouraged patient to check with insurance company regarding coverage for weight loss surgery. -Continue efforts with healthy eating and increased physical activity.  Hypertension Blood pressure slightly elevated at 128/88. Discussed the importance of a low-salt diet and regular physical activity. -Continue monitoring blood pressure.  Hernia- Umbilical Patient reports occasional tenderness. Discussed the possibility of hernia repair surgery. -Advised patient to check with insurance company regarding coverage for hernia repair surgery.  Bowel Changes Patient reports  changes in bowel color. Assessed and determined to be likely related to diet. Advised patient to monitor and report any changes in bowel color to light brown or white. -Continue monitoring bowel movements.  Urinary Frequency Patient reports increased frequency of urination, likely due to increased water intake. No other urinary symptoms reported. -Continue monitoring urinary frequency.  Oral Health Identified a small nodule on the side of the patient's gum, likely due to pressure from chewing. Discussed the possibility of removal if it becomes bothersome. -Monitor nodule and consider removal if it grows or becomes bothersome.  General Health Maintenance Discussed the benefits of the shingles vaccine. Patient will consider and decide at a later date. -Consider shingles vaccine.  Lab Work Discussed the need for lab work to assess [employer] and potential need for weight loss medication. -Order lab work today.     1. Well adult exam Adult wellness-complete.wellness physical was conducted today. Importance of diet and exercise were discussed in detail.  Importance of stress reduction and healthy living were discussed.  In addition to this a discussion regarding safety was also covered.  We also reviewed over immunizations and gave recommendations regarding current immunization needed for age.   In addition to this additional areas were also touched on including: Preventative health exams needed:  Colonoscopy 2031  Patient was advised yearly wellness exam   2. Hyperlipidemia, unspecified hyperlipidemia type Continue healthy diet  3. Prediabetes Unfortunately patient does not qualify for [facility] he was doing very well on it We did discuss gastric bypass surgery he will discuss this and check into the cost through his insurance he will let us know  4. Morbid obesity (H[facility]) See discussion above  Follow-up within 6 months

## 2023-04-04 NOTE — Patient Instructions (Signed)

## 2023-04-21 ENCOUNTER — Ambulatory Visit: Payer: BC Managed Care – PPO | Attending: Cardiology | Admitting: Cardiology

## 2023-04-21 ENCOUNTER — Encounter: Payer: Self-pay | Admitting: Cardiology

## 2023-04-21 VITALS — BP 144/95 | HR 73 | Ht 67.0 in | Wt 309.4 lb

## 2023-04-21 DIAGNOSIS — R0609 Other forms of dyspnea: Secondary | ICD-10-CM

## 2023-04-21 DIAGNOSIS — I10 Essential (primary) hypertension: Secondary | ICD-10-CM

## 2023-04-21 NOTE — Progress Notes (Signed)
Clinical Summary [name] is a 57 y.o.male seen today for follow up of the following medical problems.       1. Abnormal EKG - noted by pcp EKG, SR with inferior and lateral precordial Twave inversions.       2. DOE 12/2019 echo LVEF 55-60%,  indet DDx Symptoms resolved with weight loss.    3. OSA - followed by Dr Frances Furbish - recent home sleep study showed severe OSA - mixed compliance with cpap  - starting to use cpap more regularly       4. HTN - has not take bp meds x 2 days.  - recent pcp visit while on medication bp 128/88     02/2022 coronary calcium score by pcp was 0     SH: son recently passed. Was in his 30s on dialysis, passed away suddenyl few days ago   Moderna covid vaccine x 3.      He is from [place], [place].   Past Medical History:  Diagnosis Date   Cancer (HCC)    kidney   Hypertension    MI (myocardial infarction) (HCC)    Renal disorder    partial R nephrectomy-cancer   Sleep apnea      No Known Allergies   Current Outpatient Medications  Medication Sig Dispense Refill   tirzepatide (MOUNJARO) 12.5 MG/0.5ML Pen INJECT 12.5 MG INTO SKIN ONCE A WEEK (Patient not taking: Reported on 04/04/2023) 2 mL 3   triamterene-hydrochlorothiazide (MAXZIDE) 75-50 MG tablet Take 1 tablet by mouth daily. 90 tablet 1   No current facility-administered medications for this visit.     Past Surgical History:  Procedure Laterality Date   COLONOSCOPY WITH PROPOFOL N/A 12/06/2019   Procedure: COLONOSCOPY WITH PROPOFOL;  Surgeon: Corbin Ade, MD;  [facility]: [facility];  Service: Endoscopy;  Laterality: N/A;  1:15pm   ESOPHAGOGASTRODUODENOSCOPY     ruptured ulcer".   KIDNEY SURGERY Right    Partial nephrectomy d/t kidney cancer   POLYPECTOMY  12/06/2019   Procedure: POLYPECTOMY;  Surgeon: Corbin Ade, MD;  Location: [facility];  Service: Endoscopy;;     No Known Allergies    Family History  Problem Relation Age of Onset   Colon  polyps Father    Cancer Father    Lupus Mother    Colon cancer Neg Hx      Social History [name] reports that he quit smoking about 18 years ago. His smoking use included cigarettes. He started smoking about 25 years ago. He has a 3.5 pack-year smoking history. He has never used smokeless tobacco. [name] reports current alcohol use.   Review of Systems CONSTITUTIONAL: No weight loss, fever, chills, weakness or fatigue.  HEENT: Eyes: No visual loss, blurred vision, double vision or yellow sclerae.No hearing loss, sneezing, congestion, runny nose or sore throat.  SKIN: No rash or itching.  CARDIOVASCULAR: per hpi RESPIRATORY: No shortness of breath, cough or sputum.  GASTROINTESTINAL: No anorexia, nausea, vomiting or diarrhea. No abdominal pain or blood.  GENITOURINARY: No burning on urination, no polyuria NEUROLOGICAL: No headache, dizziness, syncope, paralysis, ataxia, numbness or tingling in the extremities. No change in bowel or bladder control.  MUSCULOSKELETAL: No muscle, back pain, joint pain or stiffness.  LYMPHATICS: No enlarged nodes. No history of splenectomy.  PSYCHIATRIC: No history of depression or anxiety.  ENDOCRINOLOGIC: No reports of sweating, cold or heat intolerance. No polyuria or polydipsia.  Marland Kitchen   Physical Examination Today's  Vitals   04/21/23 1012 04/21/23 1020  BP: (!) 142/84 (!) 148/94  Pulse: 73   SpO2: 97%   Weight: (!) 309 lb 6.4 oz (140.3 kg)   Height: 5\' 7"  (1.702 m)    Body mass index is 48.46 kg/m.  Gen: resting comfortably, no acute distress HEENT: no scleral icterus, pupils equal round and reactive, no palptable cervical adenopathy,  CV: RRR, no mrg, no jvd Resp: Clear to auscultation bilaterally GI: abdomen is soft, non-tender, non-distended, normal bowel sounds, no hepatosplenomegaly MSK: extremities are warm, no edema.  Skin: warm, no rash Neuro:  no focal deficits Psych: appropriate affect   Diagnostic Studies  12/2019  echo IMPRESSIONS     1. Left ventricular ejection fraction, by estimation, is 55 to 60%. The  left ventricle has normal function. Left ventricular endocardial border  not optimally defined to evaluate regional wall motion. There is mild left  ventricular hypertrophy. Left  ventricular diastolic parameters are indeterminate.   2. Right ventricular systolic function is normal. The right ventricular  size is normal.   3. The mitral valve is normal in structure. No evidence of mitral valve  regurgitation. No evidence of mitral stenosis.   4. The aortic valve has an indeterminant number of cusps. Aortic valve  regurgitation is not visualized. No aortic stenosis is present.   5. The inferior vena cava is normal in size with greater than 50%  respiratory variability, suggesting right atrial pressure of 3 mmHg.    02/2022 coronary calcium score: 0   Assessment and Plan   1. DOE -benign echo in 2021 - symptoms resolved with weight loss - continue to monitor   2. HTN -bp's elevated but forgot bp med last 2 days - at recent pcp visit was 120s/80s while on medication - continue current meds, encouraged regular compliance.  EKG today shows NSR, chronic ST/T changes  F/u 1 year     Antoine Poche, M.D.

## 2023-04-21 NOTE — Patient Instructions (Signed)

## 2023-08-29 ENCOUNTER — Emergency Department (HOSPITAL_COMMUNITY)
Admission: EM | Admit: 2023-08-29 | Discharge: 2023-08-29 | Disposition: A | Discharge status: Home / Self Care | Attending: Emergency Medicine | Admitting: Emergency Medicine

## 2023-08-29 ENCOUNTER — Emergency Department (HOSPITAL_COMMUNITY)

## 2023-08-29 ENCOUNTER — Other Ambulatory Visit: Payer: Self-pay

## 2023-08-29 ENCOUNTER — Encounter (HOSPITAL_COMMUNITY): Payer: Self-pay

## 2023-08-29 DIAGNOSIS — S060X1A Concussion with loss of consciousness of 30 minutes or less, initial encounter: Secondary | ICD-10-CM | POA: Insufficient documentation

## 2023-08-29 DIAGNOSIS — Z85528 Personal history of other malignant neoplasm of kidney: Secondary | ICD-10-CM | POA: Insufficient documentation

## 2023-08-29 DIAGNOSIS — W228XXA Striking against or struck by other objects, initial encounter: Secondary | ICD-10-CM | POA: Insufficient documentation

## 2023-08-29 DIAGNOSIS — I10 Essential (primary) hypertension: Secondary | ICD-10-CM | POA: Insufficient documentation

## 2023-08-29 DIAGNOSIS — S0990XA Unspecified injury of head, initial encounter: Secondary | ICD-10-CM | POA: Diagnosis not present

## 2023-08-29 DIAGNOSIS — Z87891 Personal history of nicotine dependence: Secondary | ICD-10-CM | POA: Insufficient documentation

## 2023-08-29 DIAGNOSIS — Y990 Civilian activity done for income or pay: Secondary | ICD-10-CM | POA: Insufficient documentation

## 2023-08-29 MED ORDER — ONDANSETRON 4 MG PO TBDP
4.0000 mg | ORAL_TABLET | Freq: Once | ORAL | Status: AC
  Administered 2023-08-29: 4 mg via ORAL
  Filled 2023-08-29: qty 1

## 2023-08-29 MED ORDER — ACETAMINOPHEN 500 MG PO TABS
1000.0000 mg | ORAL_TABLET | Freq: Once | ORAL | Status: AC
  Administered 2023-08-29: 1000 mg via ORAL
  Filled 2023-08-29: qty 2

## 2023-08-29 NOTE — Discharge Instructions (Addendum)
 We evaluated you for your head injury.  Your CT scan was negative.  You probably have a concussion.  Please take Tylenol and Motrin for your symptoms at home.  You can take 1000 mg of Tylenol every 6 hours and 600 mg of ibuprofen every 6 hours as needed for your symptoms.  You can take these medicines together as needed, either at the same time, or alternating every 3 hours.  Please try to avoid activities that make your headache worse.  You may need to rest for a few days before you start to feel better.  Please follow-up closely with your primary doctor.  For any new or worsening symptoms, please return to the emergency department.
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
## 2023-09-02 ENCOUNTER — Telehealth: Payer: Self-pay | Admitting: Family Medicine

## 2023-09-02 ENCOUNTER — Encounter: Payer: Self-pay | Admitting: Family Medicine

## 2023-09-02 MED ORDER — DOXYCYCLINE HYCLATE 100 MG PO TABS: 100.0000 mg | ORAL_TABLET | Freq: Two times a day (BID) | ORAL | 0 refills | Status: DC

## 2023-09-02 NOTE — Telephone Encounter (Signed)
 Patient recently in the ER because of a head injury with loss consciousness CAT scan was normal he states several days later he started experiencing body aches leg aches feeling fatigued tired not feeling good denied high fever chills sweats or rash but he did recall having a tick bite approximately a week previous Patient states he started to feel better now body aches are gone away he has more energy no fevers.  After shared discussion it was felt reasonable to send in doxycycline for 1 twice daily for the course of the next 7 days proper way to take it was discussed if he gets worse he will need to be seen here urgent care or ER  He was encouraged to schedule a follow-up visit regarding his head injury if he is having any ongoing symptoms and to do a regular scheduled follow-up for his other health issues that he might be having  Should be noted that patient approached me outside of the office regarding this concern-certainly willing to help-but needs a follow-up in the office of any ongoing troubles Dr. Lilyan Punt 09/02/2023 7:20 PM

## 2023-10-12 ENCOUNTER — Encounter: Payer: Self-pay | Admitting: Family Medicine

## 2023-11-16 ENCOUNTER — Ambulatory Visit: Admitting: Family Medicine

## 2023-11-16 ENCOUNTER — Other Ambulatory Visit (HOSPITAL_COMMUNITY): Payer: Self-pay

## 2023-11-16 ENCOUNTER — Encounter: Payer: Self-pay | Admitting: Family Medicine

## 2023-11-16 VITALS — BP 156/98 | HR 77 | Ht 67.0 in | Wt 320.0 lb

## 2023-11-16 DIAGNOSIS — I10 Essential (primary) hypertension: Secondary | ICD-10-CM

## 2023-11-16 DIAGNOSIS — E785 Hyperlipidemia, unspecified: Secondary | ICD-10-CM | POA: Diagnosis not present

## 2023-11-16 DIAGNOSIS — R7303 Prediabetes: Secondary | ICD-10-CM | POA: Diagnosis not present

## 2023-11-16 DIAGNOSIS — Z125 Encounter for screening for malignant neoplasm of prostate: Secondary | ICD-10-CM

## 2023-11-16 MED ORDER — TRIAMTERENE-HCTZ 75-50 MG PO TABS: 1.0000 | ORAL_TABLET | Freq: Every day | ORAL | 1 refills | Status: AC

## 2023-11-16 MED ORDER — TIRZEPATIDE-WEIGHT MANAGEMENT 2.5 MG/0.5ML ~~LOC~~ SOLN: 2.5000 mg | SUBCUTANEOUS | 0 refills | Status: DC

## 2023-11-16 NOTE — Progress Notes (Signed)
 Subjective:    Patient ID: [name], male    DOB: April 23, 1966, 58 y.o.   MRN: [id number]  HPI Discussed the use of AI scribe software for clinical note transcription with the patient, who gave verbal consent to proceed.  History of Present Illness   [name] is a 58 year old male with hypertension who presents for medication management and blood pressure evaluation.  He has been off his blood pressure medication for approximately two months, resulting in elevated blood pressure readings. He attributes some of the elevation to stress and rushing to appointments. He expresses a need to resume his medication.  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 He often eats late at night, sometimes as late as 11 PM or midnight, and then goes to bed shortly after. He acknowledges that this pattern is not beneficial for his health and is trying to make changes, such as avoiding eating after dark.  He frequently feels tired and recognizes the need to slow down to maintain his health. He has enlisted the help of a Secretary/administrator to manage some tasks around the house and is considering increasing his physical activity, such as riding a bicycle, to help with weight management.  He has a history of normal A1c levels, indicating no diabetes. His wife recently had a concerning doctor's visit, which added to his stress. 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      Patient does try to fit in walking is much as he can outside of his job He is very busy with his work In addition to this also has difficult time with energy level He he does try to be healthy with eating habits tries to minimize starches carbohydrates etc. He has tried Wegovy  in the past and did really well with losing weight   Review of Systems     Objective:   Physical Exam General-in no acute distress Eyes-no  discharge Lungs-respiratory rate normal, CTA CV-no murmurs,RRR Extremities skin warm dry no edema Neuro grossly normal Behavior normal, alert        Assessment & Plan:   Assessment and Plan    Knee Pain Ongoing knee pain affects daily activities. No specific diagnosis or treatment plan provided.  Hypertension Blood pressure elevated at 156/98 mmHg. Off antihypertensive medication for two months. Emphasized importance of blood pressure control to prevent complications. Willing to resume medication. - Prescribe antihypertensive medication and send prescription to pharmacy. - Recheck blood pressure in 4-6 weeks with doctor or Miss Orelia Binet. - Order blood work to assess overall health status.  Weight Management Concerned about weight and dietary habits. Acknowledged need for lifestyle changes. A1c normal, not diabetic. Discussed dietary changes and potential weight management medications. Explained insurance process for medication coverage. - Send prescription for weight management medication to pharmacy for insurance evaluation. - Encourage dietary changes, avoid late-night eating, consider pre-prepared salads.  General Health Maintenance Due for routine blood work. Emphasized importance of regular monitoring. - Order blood work to be done at his convenience.     1. Hyperlipidemia, unspecified hyperlipidemia type (Primary) Check lipid profile.  Healthy diet. - Lipid Panel  2. Prediabetes Portion control regular physical activity minimize carbohydrates - Hepatic Function Panel - Hemoglobin A1c  3. HTN (hypertension), benign Resume blood pressure medicine patient ran out check lab work await results Follow-up blood work Follow-up blood pressure in 4 weeks  Wellness visit by the end of the year - Basic Metabolic Panel - Microalbumin/Creatinine Ratio, Urine  4. Morbid obesity (HCC) Portion control regular physical activity Patient is trying his best to dietary measures  calorie reduction exercise and has had very difficult about losing weight.  He would benefit from a GLP-1 medicine.  Hopefully it is covered.  I have talked with him regarding gastric bypass surgery he does not want to do that because of concerns of surgery complications.  5. Screening for prostate cancer Screening. - PSA

## 2023-11-18 ENCOUNTER — Other Ambulatory Visit (HOSPITAL_COMMUNITY): Payer: Self-pay

## 2023-11-22 ENCOUNTER — Other Ambulatory Visit (HOSPITAL_COMMUNITY): Payer: Self-pay

## 2023-11-22 ENCOUNTER — Telehealth: Payer: Self-pay | Admitting: Pharmacy Technician

## 2023-11-22 ENCOUNTER — Encounter: Payer: Self-pay | Admitting: Pharmacy Technician

## 2023-11-22 NOTE — Telephone Encounter (Signed)
 Pharmacy Patient Advocate Encounter   Received notification from CoverMyMeds that prior authorization for ZEPBOUND  2.5MG /0.5ML AUTO-INJECTORS is required/requested.   Insurance verification completed.   The patient is insured through [facility] .   Per test claim: PA required; PA submitted to above mentioned insurance via Fax Key/confirmation #/EOC [id number] Status is pending

## 2023-11-22 NOTE — Telephone Encounter (Signed)
 ERROR

## 2023-11-24 ENCOUNTER — Other Ambulatory Visit (HOSPITAL_COMMUNITY): Payer: Self-pay

## 2023-11-24 NOTE — Telephone Encounter (Signed)
 Pharmacy Patient Advocate Encounter  Received notification from [facility] that Prior Authorization for Zepbound  has been APPROVED from 11/22/2023 to 03/13/2024. Unable to obtain price due to refill too soon rejection, last fill date 11/24/2023 next available fill date07/17/2025

## 2023-12-29 ENCOUNTER — Ambulatory Visit: Admitting: Nurse Practitioner

## 2023-12-29 ENCOUNTER — Other Ambulatory Visit: Payer: Self-pay | Admitting: Nurse Practitioner

## 2023-12-29 ENCOUNTER — Encounter: Payer: Self-pay | Admitting: Nurse Practitioner

## 2023-12-29 VITALS — BP 146/79 | HR 78 | Temp 97.9°F | Ht 67.0 in | Wt 319.0 lb

## 2023-12-29 DIAGNOSIS — G4733 Obstructive sleep apnea (adult) (pediatric): Secondary | ICD-10-CM | POA: Diagnosis not present

## 2023-12-29 DIAGNOSIS — I10 Essential (primary) hypertension: Secondary | ICD-10-CM | POA: Diagnosis not present

## 2023-12-29 MED ORDER — TIRZEPATIDE-WEIGHT MANAGEMENT 5 MG/0.5ML ~~LOC~~ SOLN: 5.0000 mg | SUBCUTANEOUS | 2 refills | Status: DC

## 2023-12-30 ENCOUNTER — Encounter: Payer: Self-pay | Admitting: Nurse Practitioner

## 2023-12-30 ENCOUNTER — Other Ambulatory Visit: Payer: Self-pay | Admitting: Nurse Practitioner

## 2023-12-30 MED ORDER — ZEPBOUND 5 MG/0.5ML ~~LOC~~ SOAJ: 5.0000 mg | SUBCUTANEOUS | 2 refills | Status: AC

## 2023-12-30 NOTE — Progress Notes (Signed)
 Subjective:    Patient ID: [name], male    DOB: 23-Aug-1965, 58 y.o.   MRN: [id number]  HPI Presents for recheck on his blood pressure.  Needs a new machine so we can check it at home.  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  Very limited sleep, maybe 4 to 5 hours a night.  Wears his CPAP.  Currently on Zepbound  2.5 mg weekly.  Would like to go up to the next dose.  Denies any adverse effects.  Limited activity.  States he used to do have a extended walk around the neighborhood but has not done this in a long time due to the busyness of his schedule.   Review of Systems  Constitutional:  Positive for fatigue.  Respiratory:  Positive for shortness of breath. Negative for cough, chest tightness and wheezing.        Had 1 episode of shortness of breath when walking in the heat recently, no other episodes have been noted.  Cardiovascular:  Positive for leg swelling. Negative for chest pain.       Minimal leg swelling at times.  Gastrointestinal:  Negative for abdominal pain, blood in stool, constipation, diarrhea, nausea and vomiting.      12/29/2023    3:03 PM  Depression screen PHQ 2/9  Decreased Interest 0  Down, Depressed, Hopeless 0  PHQ - 2 Score 0  Altered sleeping 0  Tired, decreased energy 0  Change in appetite 0  Feeling bad or failure about yourself  0  Trouble concentrating 0  Moving slowly or fidgety/restless 0  Suicidal thoughts 0  PHQ-9 Score 0  Difficult doing work/chores Not difficult at all      12/29/2023    3:03 PM 04/04/2023   10:09 AM 02/25/2023    3:32 PM 08/30/2022    8:47 AM  GAD 7 : Generalized Anxiety Score  Nervous, Anxious, on Edge 0 0 1 0  Control/stop worrying 0 0 0 0  Worry too much - different things 0 0 1 0  Trouble relaxing 0 0 0 0  Restless 0 0 0 0  Easily annoyed or irritable 0 0 0 0  Afraid - awful might happen 0 0 1 0  Total GAD 7 Score 0 0 3 0  Anxiety Difficulty Not  difficult at all  Not difficult at all     Social History   Tobacco Use   Smoking status: Former    Current packs/day: 0.00    Average packs/day: 0.5 packs/day for 7.0 years (3.5 ttl pk-yrs)    Types: Cigarettes    Start date: 12/03/1997    Quit date: 12/03/2004    Years since quitting: 19.0   Smokeless tobacco: Never  Vaping Use   Vaping status: Never Used  Substance Use Topics   Alcohol use: Yes    Comment: occas wine   Drug use: No        Objective:   Physical Exam Vitals and nursing note reviewed.  Constitutional:      General: He is not in acute distress.    Comments: Significant central obesity noted.  Cardiovascular:     Rate and Rhythm: Normal rate and regular rhythm.  Pulmonary:     Effort: Pulmonary effort is normal.     Breath sounds: Normal breath sounds.  Abdominal:     General: There is no distension.     Palpations: Abdomen is soft.  Tenderness: There is no abdominal tenderness.  Musculoskeletal:     Comments: Trace pitting edema noted lower extremities bilaterally.  Neurological:     Mental Status: He is alert.  Psychiatric:        Mood and Affect: Mood normal.        Behavior: Behavior normal.        Thought Content: Thought content normal.        Judgment: Judgment normal.    Today's Vitals   12/29/23 1501 12/29/23 1525  BP: (!) 166/99 (!) 146/79  Pulse: 78   Temp: 97.9 F (36.6 C)   SpO2: 96%   Weight: (!) 319 lb (144.7 kg)   Height: 5' 7 (1.702 m)    Body mass index is 49.96 kg/m.        Assessment & Plan:   Problem List Items Addressed This Visit       Cardiovascular and Mediastinum   HTN (hypertension), benign - Primary     Respiratory   Sleep apnea     Other   Morbid obesity (HCC)   Relevant Medications   tirzepatide  5 MG/0.5ML injection vial   Meds ordered this encounter  Medications   tirzepatide  5 MG/0.5ML injection vial    Sig: Inject 5 mg into the skin once a week.    Dispense:  2 mL    Refill:  2     Supervising Provider:   ALPHONSA HAMILTON A [id number]   Patient given a prescription for home BP monitor.  Recommend he check his blood pressure once daily and send results to the office.  May need a second medication for his blood pressure. Increase Tirzepatide  to 5 mg weekly.  Again reviewed potential adverse effects.  Contact office if any problems. Encourage patient to get his lab work done that was ordered previously. Discussed healthy lifestyle including activity such as walking, healthy diet, weight loss and adequate rest. Return in about 3 months (around 03/30/2024).

## 2024-01-13 ENCOUNTER — Other Ambulatory Visit (HOSPITAL_COMMUNITY): Payer: Self-pay

## 2024-03-17 ENCOUNTER — Other Ambulatory Visit: Payer: Self-pay

## 2024-03-17 ENCOUNTER — Encounter (HOSPITAL_COMMUNITY): Payer: Self-pay

## 2024-03-17 ENCOUNTER — Emergency Department (HOSPITAL_COMMUNITY)
Admission: EM | Admit: 2024-03-17 | Discharge: 2024-03-17 | Disposition: A | Discharge status: Home / Self Care | Attending: Emergency Medicine | Admitting: Emergency Medicine

## 2024-03-17 DIAGNOSIS — T22111A Burn of first degree of right forearm, initial encounter: Secondary | ICD-10-CM | POA: Insufficient documentation

## 2024-03-17 DIAGNOSIS — Z23 Encounter for immunization: Secondary | ICD-10-CM | POA: Insufficient documentation

## 2024-03-17 DIAGNOSIS — T22112A Burn of first degree of left forearm, initial encounter: Secondary | ICD-10-CM | POA: Diagnosis not present

## 2024-03-17 DIAGNOSIS — X158XXA Contact with other hot household appliances, initial encounter: Secondary | ICD-10-CM | POA: Diagnosis not present

## 2024-03-17 DIAGNOSIS — T310 Burns involving less than 10% of body surface: Secondary | ICD-10-CM | POA: Insufficient documentation

## 2024-03-17 DIAGNOSIS — T22119A Burn of first degree of unspecified forearm, initial encounter: Secondary | ICD-10-CM

## 2024-03-17 DIAGNOSIS — T22212A Burn of second degree of left forearm, initial encounter: Secondary | ICD-10-CM | POA: Diagnosis not present

## 2024-03-17 MED ORDER — OXYCODONE-ACETAMINOPHEN 5-325 MG PO TABS: 1.0000 | ORAL_TABLET | Freq: Four times a day (QID) | ORAL | 0 refills | Status: AC | PRN

## 2024-03-17 MED ORDER — NAPROXEN 375 MG PO TABS: 375.0000 mg | ORAL_TABLET | Freq: Two times a day (BID) | ORAL | 0 refills | Status: AC

## 2024-03-17 MED ORDER — BACITRACIN ZINC 500 UNIT/GM EX OINT
TOPICAL_OINTMENT | Freq: Once | CUTANEOUS | Status: AC
  Filled 2024-03-17: qty 3.6

## 2024-03-17 MED ORDER — ONDANSETRON HCL 4 MG/2ML IJ SOLN
4.0000 mg | Freq: Once | INTRAMUSCULAR | Status: AC
  Administered 2024-03-17: 4 mg via INTRAVENOUS
  Filled 2024-03-17: qty 2

## 2024-03-17 MED ORDER — BACITRACIN ZINC 500 UNIT/GM EX OINT: 1.0000 | TOPICAL_OINTMENT | Freq: Two times a day (BID) | CUTANEOUS | 0 refills | Status: AC

## 2024-03-17 MED ORDER — KETOROLAC TROMETHAMINE 15 MG/ML IJ SOLN
15.0000 mg | Freq: Once | INTRAMUSCULAR | Status: AC
  Administered 2024-03-17: 15 mg via INTRAVENOUS
  Filled 2024-03-17: qty 1

## 2024-03-17 MED ORDER — HYDROMORPHONE HCL 1 MG/ML IJ SOLN
1.0000 mg | Freq: Once | INTRAMUSCULAR | Status: AC
  Administered 2024-03-17: 1 mg via INTRAVENOUS
  Filled 2024-03-17: qty 1

## 2024-03-17 MED ORDER — TETANUS-DIPHTH-ACELL PERTUSSIS 5-2-15.5 LF-MCG/0.5 IM SUSP
0.5000 mL | Freq: Once | INTRAMUSCULAR | Status: AC
  Administered 2024-03-17: 0.5 mL via INTRAMUSCULAR
  Filled 2024-03-17: qty 0.5

## 2024-03-17 MED ORDER — OXYCODONE-ACETAMINOPHEN 5-325 MG PO TABS
1.0000 | ORAL_TABLET | Freq: Once | ORAL | Status: AC
  Administered 2024-03-17: 1 via ORAL
  Filled 2024-03-17: qty 1

## 2024-03-17 NOTE — Discharge Instructions (Addendum)
 Please follow-up closely with your primary care doctor on an outpatient basis for reevaluation.  Please continue to place the antibiotic ointment as directed at the bedside twice a day.  Return to emergency department immediately for any new or worsening symptoms.
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
## 2024-03-19 MED FILL — Oxycodone w/ Acetaminophen Tab 5-325 MG: ORAL | Qty: 6 | Status: AC

## 2024-03-26 ENCOUNTER — Ambulatory Visit: Admitting: Nurse Practitioner
